# Patient Record
Sex: Male | Born: 1980
Health system: Southern US, Community
[De-identification: ages and names within clinical notes are randomized; demographics above are authoritative.]

## PROBLEM LIST (undated history)

## (undated) DIAGNOSIS — I10 Essential (primary) hypertension: Secondary | ICD-10-CM

## (undated) DIAGNOSIS — E119 Type 2 diabetes mellitus without complications: Secondary | ICD-10-CM

---

## 2010-10-31 DIAGNOSIS — E119 Type 2 diabetes mellitus without complications: Secondary | ICD-10-CM | POA: Insufficient documentation

## 2010-10-31 DIAGNOSIS — I10 Essential (primary) hypertension: Secondary | ICD-10-CM | POA: Insufficient documentation

## 2011-02-02 DIAGNOSIS — G4733 Obstructive sleep apnea (adult) (pediatric): Secondary | ICD-10-CM | POA: Insufficient documentation

## 2018-11-12 DIAGNOSIS — K429 Umbilical hernia without obstruction or gangrene: Secondary | ICD-10-CM | POA: Insufficient documentation

## 2020-02-13 ENCOUNTER — Emergency Department (HOSPITAL_COMMUNITY)
Admission: EM | Admit: 2020-02-13 | Discharge: 2020-02-13 | Discharge status: Against Medical Advice | Payer: Medicaid - Out of State | Attending: Emergency Medicine | Admitting: Emergency Medicine

## 2020-02-13 ENCOUNTER — Emergency Department (HOSPITAL_COMMUNITY): Payer: Medicaid - Out of State

## 2020-02-13 ENCOUNTER — Other Ambulatory Visit: Payer: Self-pay

## 2020-02-13 ENCOUNTER — Encounter (HOSPITAL_COMMUNITY): Payer: Self-pay | Admitting: *Deleted

## 2020-02-13 DIAGNOSIS — M79631 Pain in right forearm: Secondary | ICD-10-CM | POA: Diagnosis not present

## 2020-02-13 DIAGNOSIS — M542 Cervicalgia: Secondary | ICD-10-CM | POA: Diagnosis not present

## 2020-02-13 DIAGNOSIS — R61 Generalized hyperhidrosis: Secondary | ICD-10-CM | POA: Insufficient documentation

## 2020-02-13 DIAGNOSIS — F17210 Nicotine dependence, cigarettes, uncomplicated: Secondary | ICD-10-CM | POA: Diagnosis not present

## 2020-02-13 DIAGNOSIS — I10 Essential (primary) hypertension: Secondary | ICD-10-CM | POA: Diagnosis not present

## 2020-02-13 DIAGNOSIS — Z5329 Procedure and treatment not carried out because of patient's decision for other reasons: Secondary | ICD-10-CM

## 2020-02-13 DIAGNOSIS — M25511 Pain in right shoulder: Secondary | ICD-10-CM | POA: Diagnosis present

## 2020-02-13 DIAGNOSIS — R000 Tachycardia, unspecified: Secondary | ICD-10-CM | POA: Insufficient documentation

## 2020-02-13 DIAGNOSIS — E1165 Type 2 diabetes mellitus with hyperglycemia: Secondary | ICD-10-CM | POA: Insufficient documentation

## 2020-02-13 DIAGNOSIS — R739 Hyperglycemia, unspecified: Secondary | ICD-10-CM

## 2020-02-13 HISTORY — DX: Essential (primary) hypertension: I10

## 2020-02-13 HISTORY — DX: Type 2 diabetes mellitus without complications: E11.9

## 2020-02-13 LAB — COMPREHENSIVE METABOLIC PANEL
ALT: 53 U/L — ABNORMAL HIGH (ref 0–44)
AST: 38 U/L (ref 15–41)
Albumin: 4.2 g/dL (ref 3.5–5.0)
Alkaline Phosphatase: 109 U/L (ref 38–126)
Anion gap: 9 (ref 5–15)
BUN: 19 mg/dL (ref 6–20)
CO2: 21 mmol/L — ABNORMAL LOW (ref 22–32)
Calcium: 9.1 mg/dL (ref 8.9–10.3)
Chloride: 102 mmol/L (ref 98–111)
Creatinine, Ser: 1.6 mg/dL — ABNORMAL HIGH (ref 0.61–1.24)
GFR, Estimated: 56 mL/min — ABNORMAL LOW (ref >60)
Glucose, Bld: 444 mg/dL — ABNORMAL HIGH (ref 70–99)
Potassium: 4.3 mmol/L (ref 3.5–5.1)
Sodium: 132 mmol/L — ABNORMAL LOW (ref 135–145)
Total Bilirubin: 0.5 mg/dL (ref 0.3–1.2)
Total Protein: 8.3 g/dL — ABNORMAL HIGH (ref 6.5–8.1)

## 2020-02-13 LAB — CBC WITH DIFFERENTIAL/PLATELET
Abs Immature Granulocytes: 0.02 10*3/uL (ref 0.00–0.07)
Basophils Absolute: 0.1 10*3/uL (ref 0.0–0.1)
Basophils Relative: 1 %
Eosinophils Absolute: 0.2 10*3/uL (ref 0.0–0.5)
Eosinophils Relative: 3 %
HCT: 42.2 % (ref 39.0–52.0)
Hemoglobin: 14.5 g/dL (ref 13.0–17.0)
Immature Granulocytes: 0 %
Lymphocytes Relative: 37 %
Lymphs Abs: 2.3 10*3/uL (ref 0.7–4.0)
MCH: 31.9 pg (ref 26.0–34.0)
MCHC: 34.4 g/dL (ref 30.0–36.0)
MCV: 93 fL (ref 80.0–100.0)
Monocytes Absolute: 0.4 10*3/uL (ref 0.1–1.0)
Monocytes Relative: 7 %
Neutro Abs: 3.3 10*3/uL (ref 1.7–7.7)
Neutrophils Relative %: 52 %
Platelets: 288 10*3/uL (ref 150–400)
RBC: 4.54 MIL/uL (ref 4.22–5.81)
RDW: 13.2 % (ref 11.5–15.5)
WBC: 6.3 10*3/uL (ref 4.0–10.5)
nRBC: 0 % (ref 0.0–0.2)

## 2020-02-13 LAB — CBG MONITORING, ED: Glucose-Capillary: 433 mg/dL — ABNORMAL HIGH (ref 70–99)

## 2020-02-13 IMAGING — DX DG CHEST 2V
2 series · 2 of 2 positions shown · non-contrast
Comparison: None.

CLINICAL DATA: Tachycardia

EXAM:
CHEST - 2 VIEW

[chest pa]
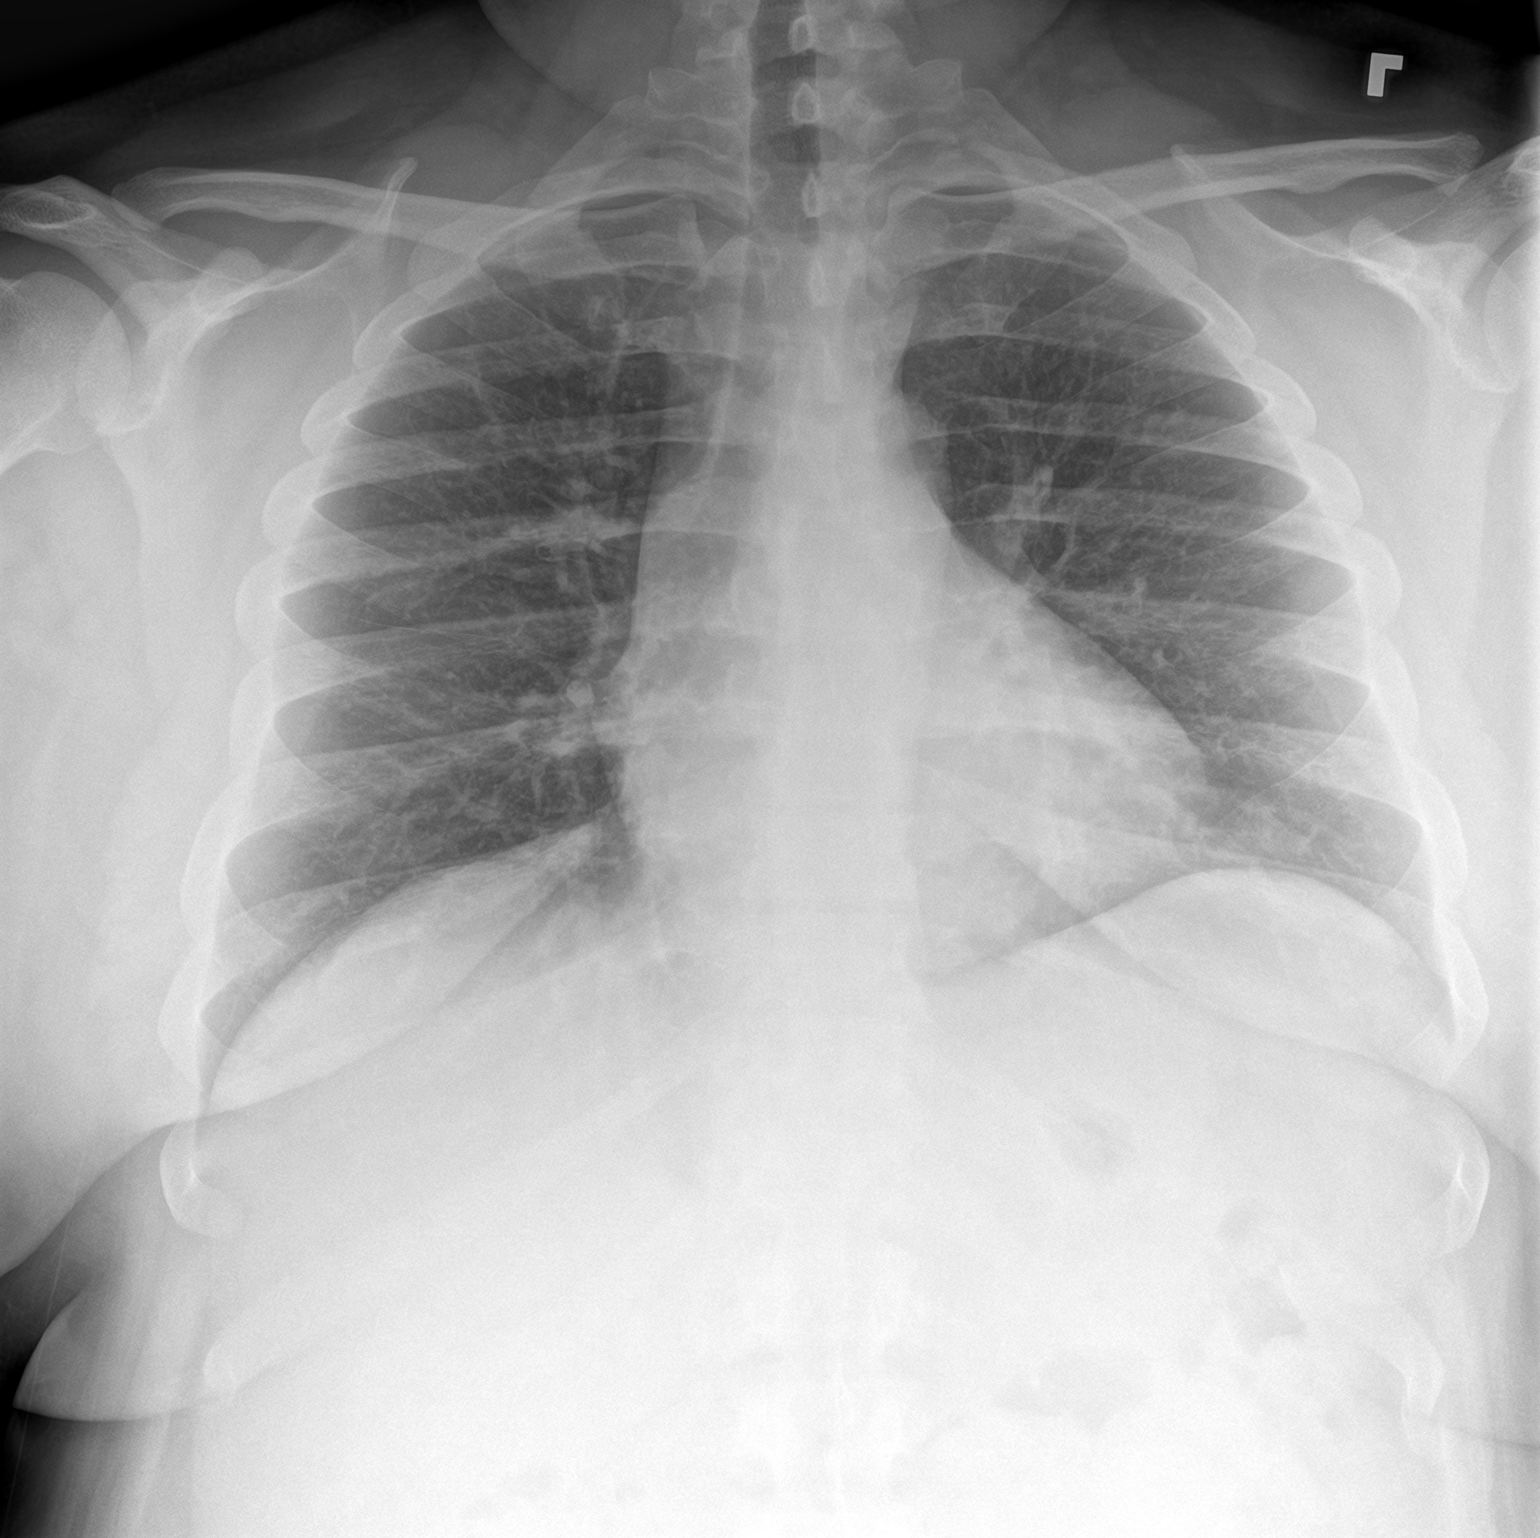

[chest lat]
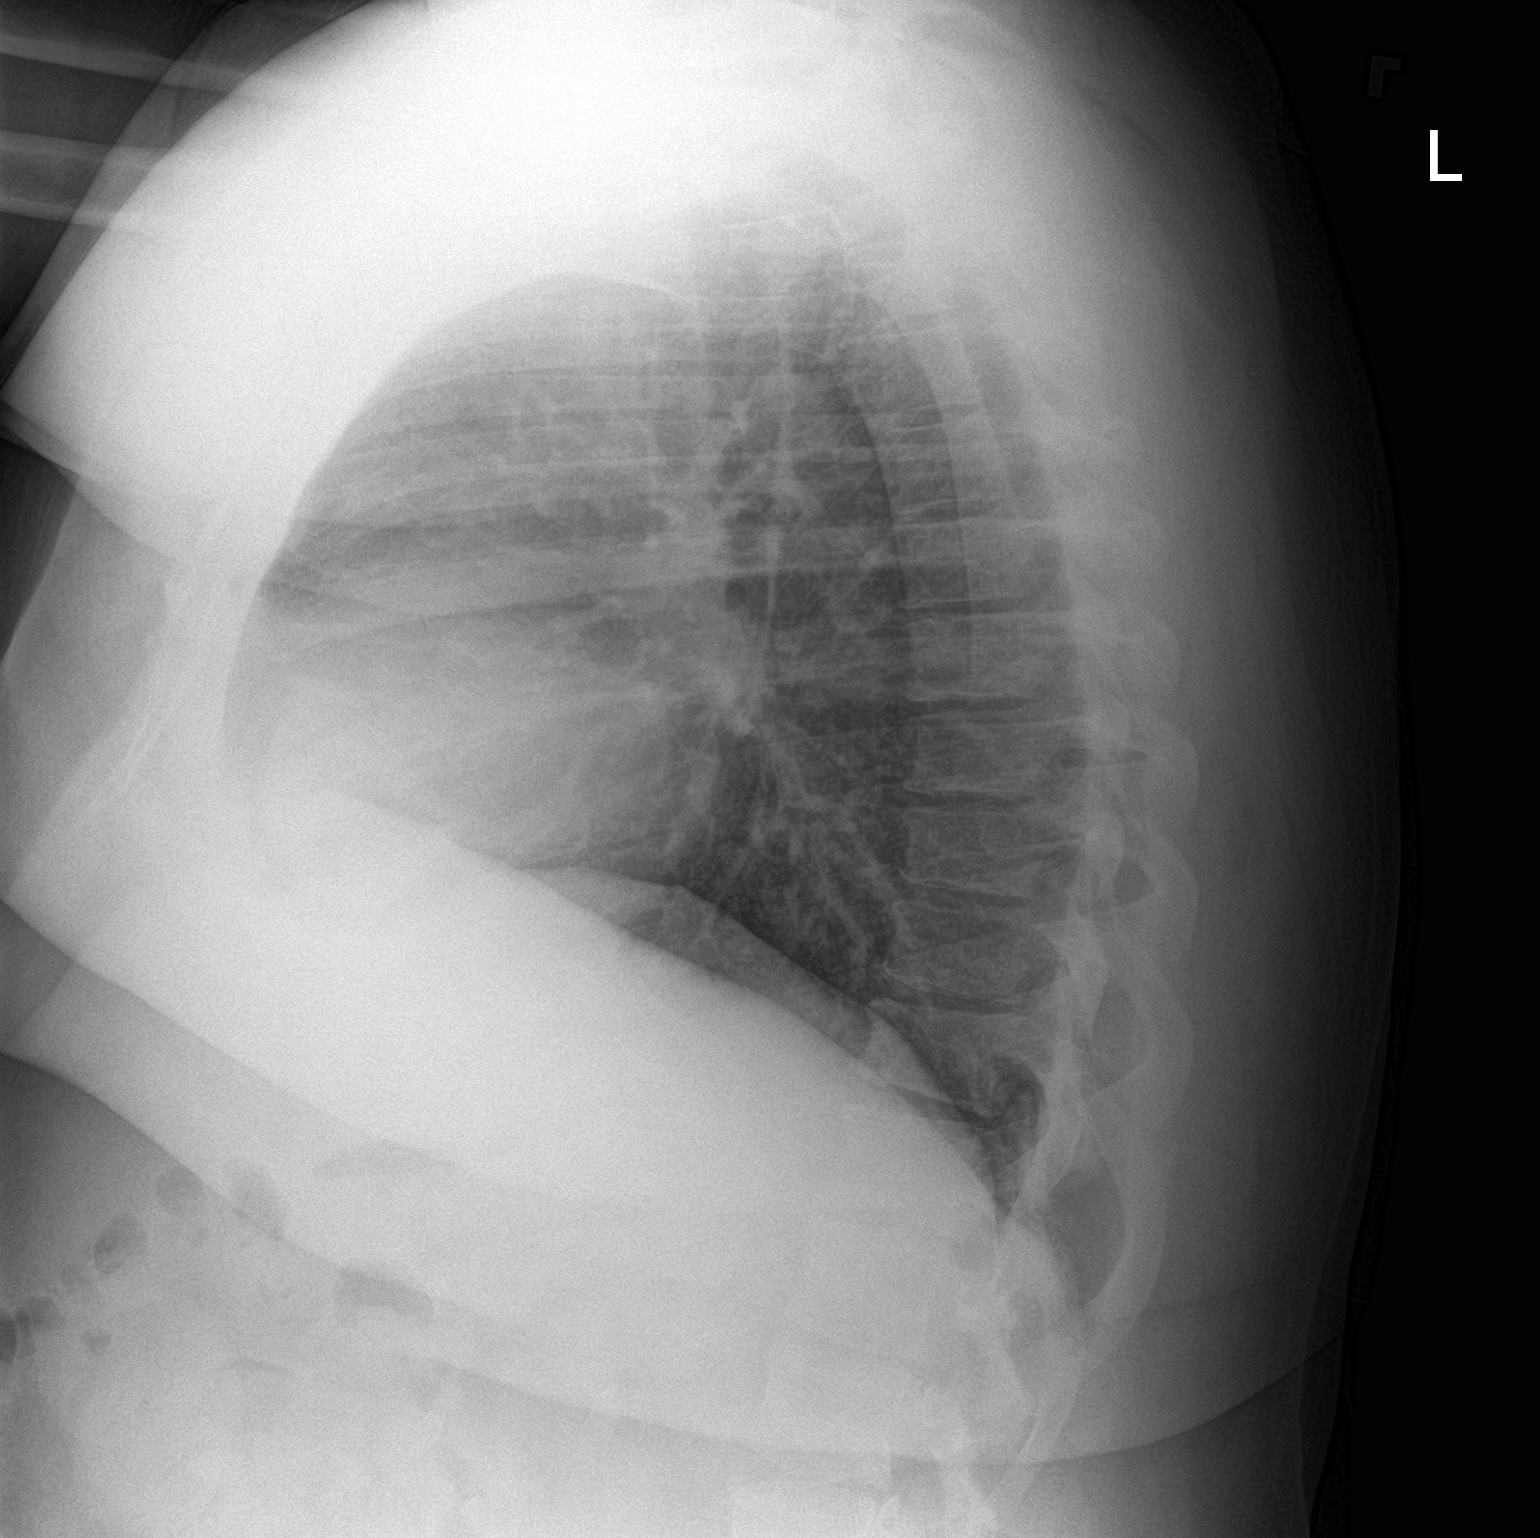

[2 of 2 positions shown; findings below may reference images not displayed]

FINDINGS: The heart size and mediastinal contours are within normal limits.
Both lungs are clear. The visualized skeletal structures are
unremarkable.
IMPRESSION: No active cardiopulmonary disease.

## 2020-02-13 IMAGING — DX DG FOREARM 2V*R*
2 series · 2 of 2 positions shown · non-contrast
Comparison: None.

CLINICAL DATA: Injury, arm pain

EXAM:
RIGHT FOREARM - 2 VIEW

[forearm ap]
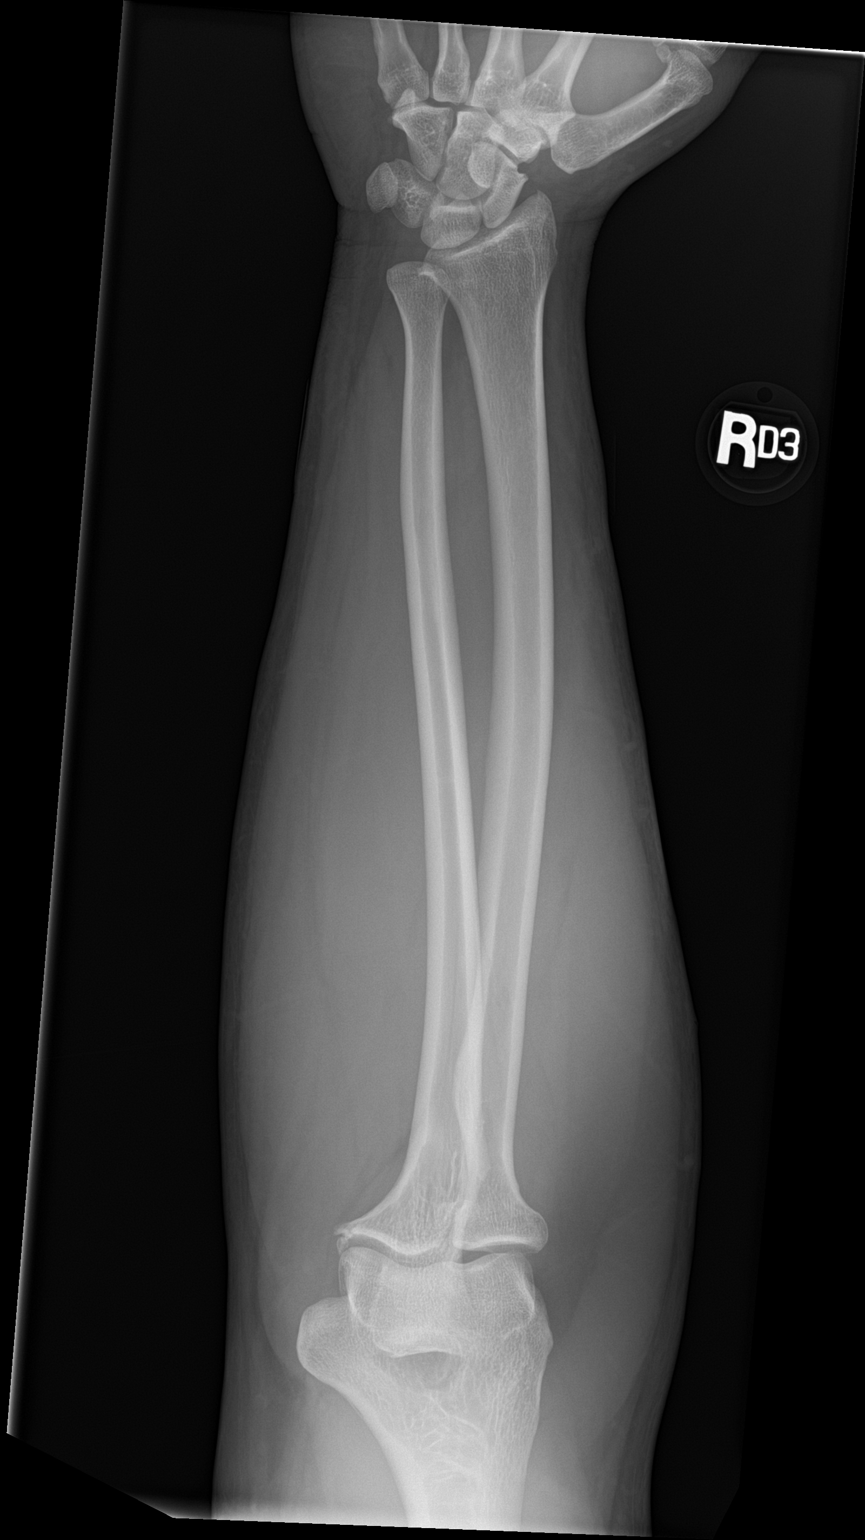

[forearm lat]
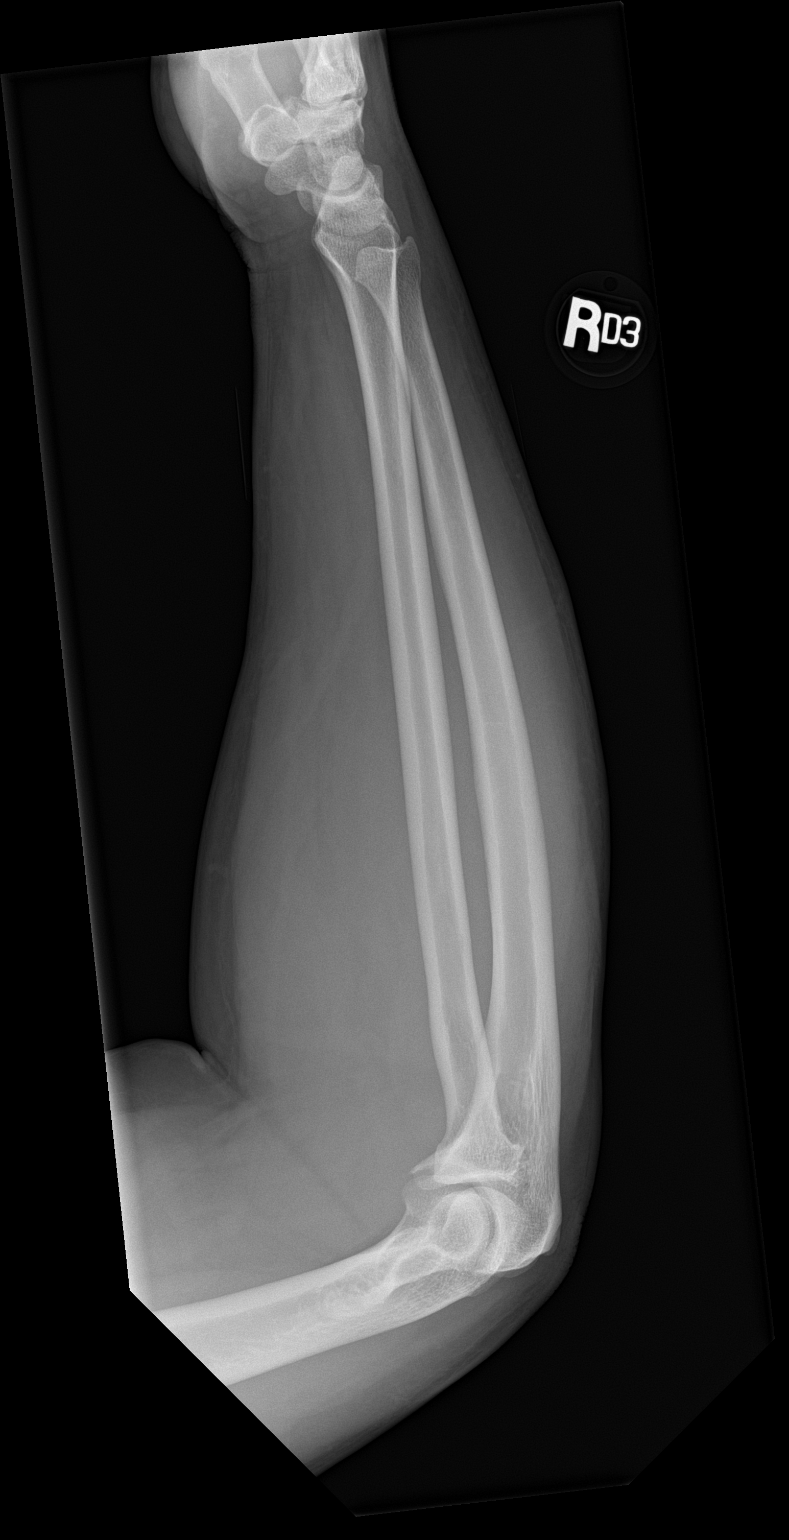

[2 of 2 positions shown; findings below may reference images not displayed]

FINDINGS: There is no evidence of fracture or other focal bone lesions. Soft
tissues are unremarkable.
IMPRESSION: Negative.

## 2020-02-13 IMAGING — DX DG SHOULDER 2+V*R*
3 series · 3 of 3 positions shown · non-contrast
Comparison: None.

CLINICAL DATA: Right shoulder pain, injury

EXAM:
RIGHT SHOULDER - 2+ VIEW

[shoulder y view]
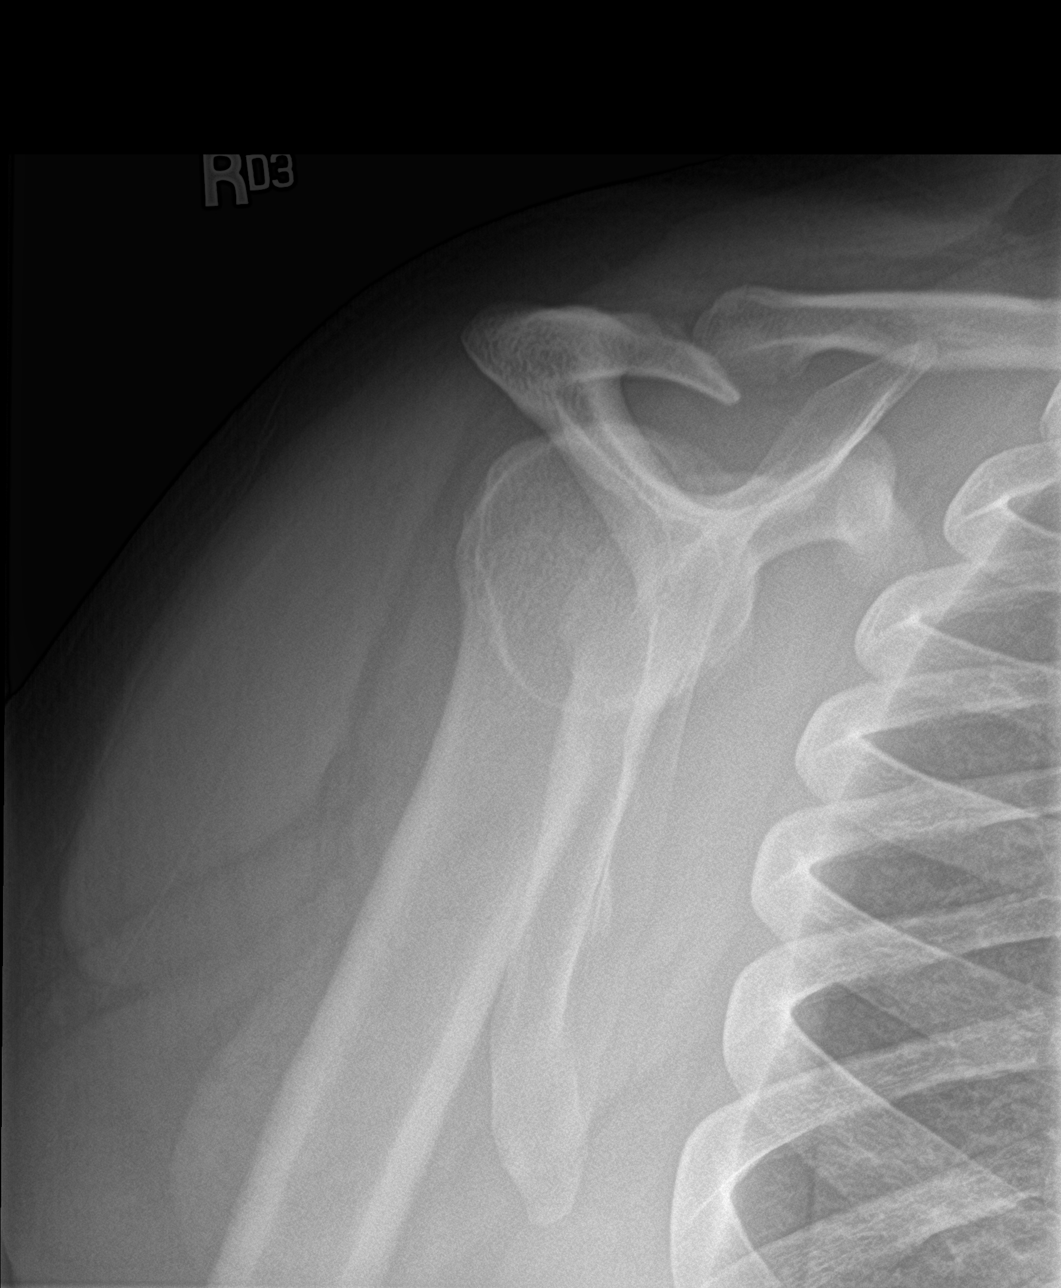

[shoulder grashey]
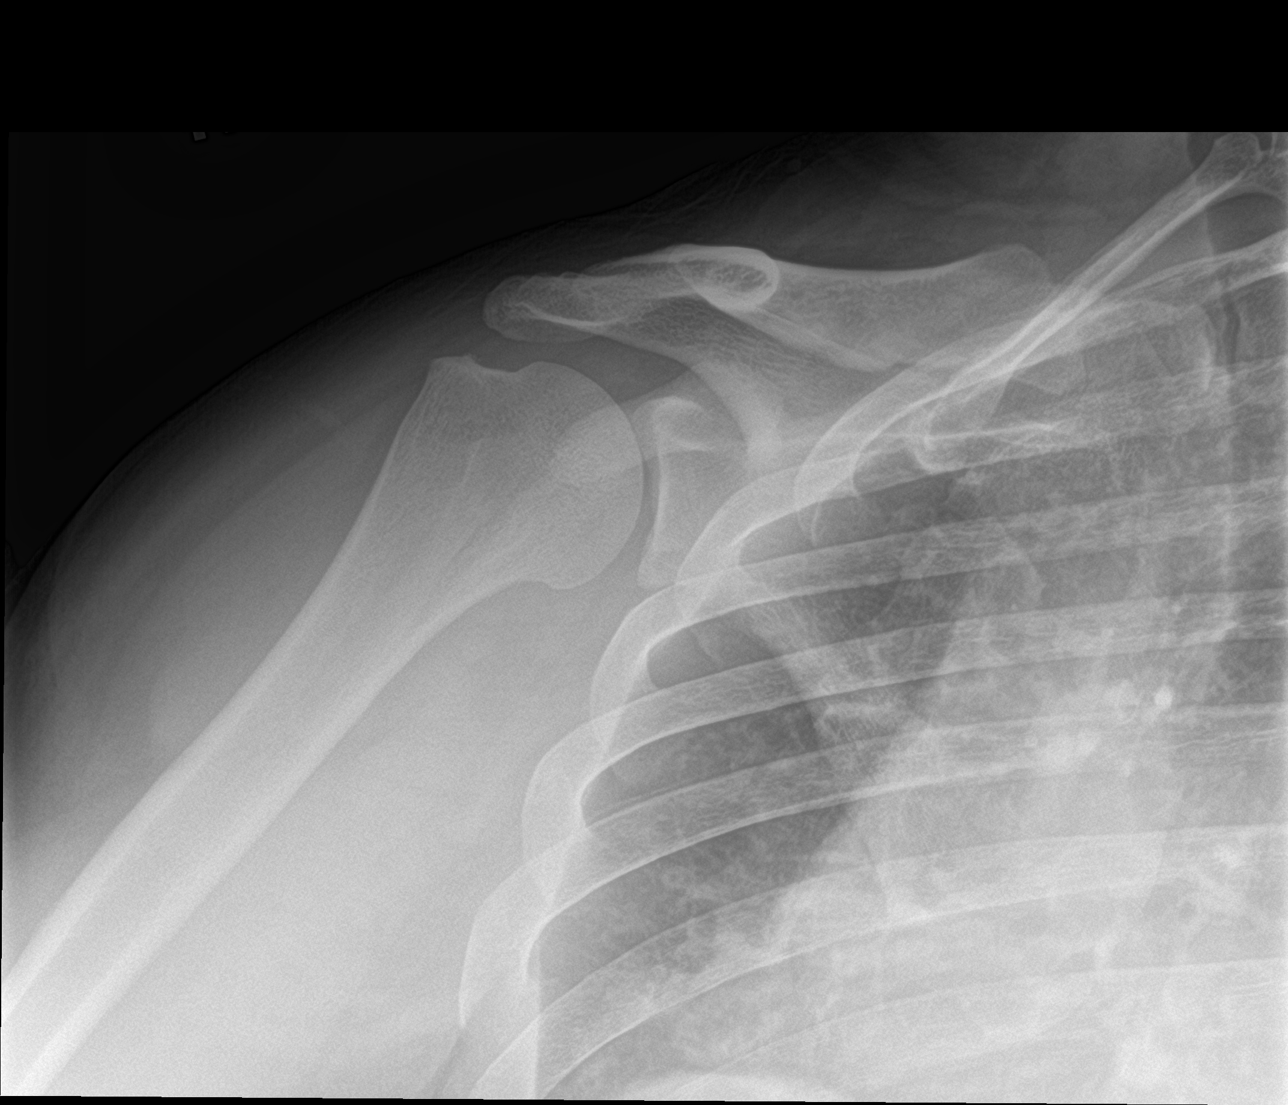

[shoulder axillary]
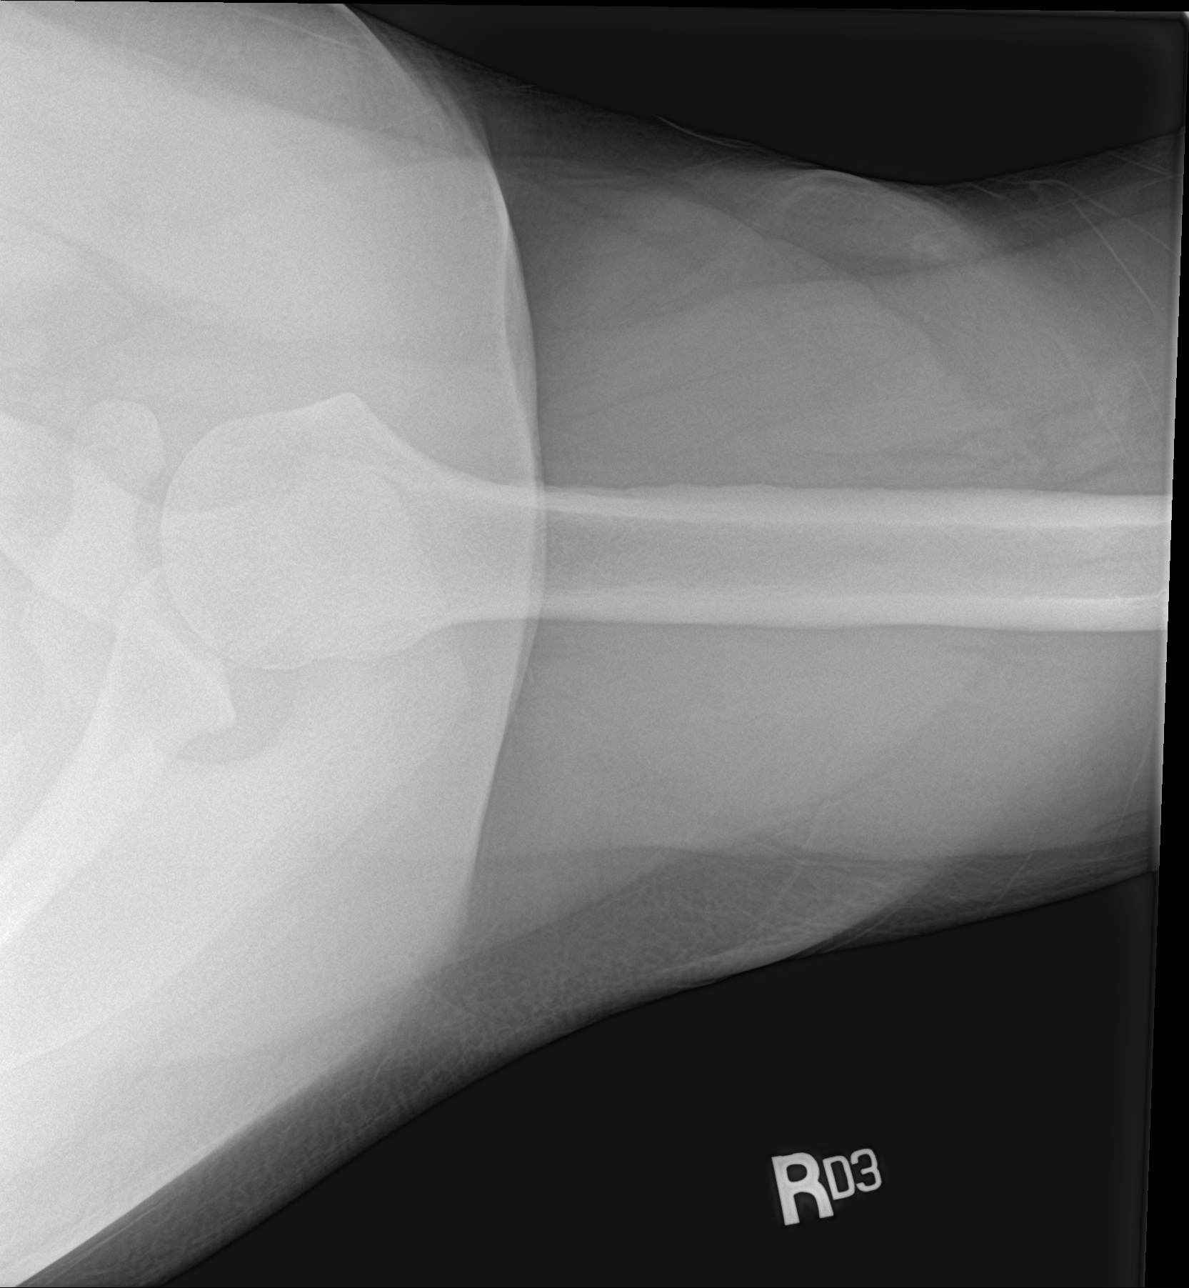

[3 of 3 positions shown; findings below may reference images not displayed]

FINDINGS: There is no evidence of fracture or dislocation. There is no
evidence of arthropathy or other focal bone abnormality. Soft
tissues are unremarkable.
IMPRESSION: Negative.

## 2020-02-13 MED ORDER — LACTATED RINGERS IV BOLUS: 1000.0000 mL | Freq: Once | INTRAVENOUS | Status: DC

## 2020-02-13 MED ORDER — LACTATED RINGERS IV BOLUS: 500.0000 mL | Freq: Once | INTRAVENOUS | Status: DC
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
## 2020-02-13 NOTE — Discharge Instructions (Signed)
As we discussed today your blood sugar is very high as is your blood pressure and your heart rate.  Today we discussed that your x-rays were overall reassuring however I recommended consideration of a CT scan on your neck.  In order to obtain this you would need IV contrast and therefore labs were obtained.  Your blood sugar was very high at 433 with a high heart rate and high blood pressure.  This combined with your sweating made me concerned that you may have an underlying electrolyte abnormality or condition.  I recommended that you stay for additional labs, evaluation, and treatment as indicated.  We discussed that failing to do this and leaving could result in delayed diagnosis and treatment which may result in severe life-threatening condition going undiagnosed. Additionally we discussed that by leaving AGAINST MEDICAL ADVICE you are at risk of death, disability that may be severe, chronic pain, and worsening of condition.  You stated your understanding of these risks.  As I do not have the remainder of your labs I do not know what medications I can safely start you on at this time therefore I would recommend diet and lifestyle changes and strongly recommend returning to the emergency room.

## 2020-02-13 NOTE — ED Notes (Signed)
Pt is declining every thing but the xray. Lanora Manis PA informed. Pt wants to speak with the PA. Elizabeth informed.

## 2020-02-15 ENCOUNTER — Emergency Department (HOSPITAL_COMMUNITY): Payer: Medicaid - Out of State

## 2020-02-15 ENCOUNTER — Encounter (HOSPITAL_COMMUNITY): Payer: Self-pay | Admitting: Emergency Medicine

## 2020-02-15 ENCOUNTER — Other Ambulatory Visit: Payer: Self-pay

## 2020-02-15 ENCOUNTER — Emergency Department (HOSPITAL_COMMUNITY)
Admission: EM | Admit: 2020-02-15 | Discharge: 2020-02-15 | Disposition: A | Discharge status: Home / Self Care | Payer: Medicaid - Out of State | Attending: Emergency Medicine | Admitting: Emergency Medicine

## 2020-02-15 DIAGNOSIS — Z7984 Long term (current) use of oral hypoglycemic drugs: Secondary | ICD-10-CM | POA: Insufficient documentation

## 2020-02-15 DIAGNOSIS — M542 Cervicalgia: Secondary | ICD-10-CM | POA: Insufficient documentation

## 2020-02-15 DIAGNOSIS — M25511 Pain in right shoulder: Secondary | ICD-10-CM | POA: Diagnosis not present

## 2020-02-15 DIAGNOSIS — E871 Hypo-osmolality and hyponatremia: Secondary | ICD-10-CM | POA: Insufficient documentation

## 2020-02-15 DIAGNOSIS — Z79899 Other long term (current) drug therapy: Secondary | ICD-10-CM | POA: Diagnosis not present

## 2020-02-15 DIAGNOSIS — E1165 Type 2 diabetes mellitus with hyperglycemia: Secondary | ICD-10-CM | POA: Insufficient documentation

## 2020-02-15 DIAGNOSIS — R739 Hyperglycemia, unspecified: Secondary | ICD-10-CM

## 2020-02-15 DIAGNOSIS — I10 Essential (primary) hypertension: Secondary | ICD-10-CM | POA: Diagnosis not present

## 2020-02-15 DIAGNOSIS — M7918 Myalgia, other site: Secondary | ICD-10-CM

## 2020-02-15 DIAGNOSIS — F17210 Nicotine dependence, cigarettes, uncomplicated: Secondary | ICD-10-CM | POA: Insufficient documentation

## 2020-02-15 DIAGNOSIS — M791 Myalgia, unspecified site: Secondary | ICD-10-CM | POA: Diagnosis not present

## 2020-02-15 LAB — BASIC METABOLIC PANEL
Anion gap: 8 (ref 5–15)
BUN: 22 mg/dL — ABNORMAL HIGH (ref 6–20)
CO2: 20 mmol/L — ABNORMAL LOW (ref 22–32)
Calcium: 9.8 mg/dL (ref 8.9–10.3)
Chloride: 98 mmol/L (ref 98–111)
Creatinine, Ser: 1.35 mg/dL — ABNORMAL HIGH (ref 0.61–1.24)
GFR, Estimated: >60 mL/min (ref >60)
Glucose, Bld: 425 mg/dL — ABNORMAL HIGH (ref 70–99)
Potassium: 4.1 mmol/L (ref 3.5–5.1)
Sodium: 126 mmol/L — ABNORMAL LOW (ref 135–145)

## 2020-02-15 LAB — CBG MONITORING, ED
Glucose-Capillary: 434 mg/dL — ABNORMAL HIGH (ref 70–99)
Glucose-Capillary: 398 mg/dL — ABNORMAL HIGH (ref 70–99)
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
## 2020-02-15 MED ORDER — METFORMIN HCL ER 500 MG PO TB24: 500.0000 mg | ORAL_TABLET | Freq: Every day | ORAL | 0 refills | Status: DC

## 2020-02-15 MED ORDER — IOHEXOL 350 MG/ML SOLN
75.0000 mL | Freq: Once | INTRAVENOUS | Status: AC | PRN
  Administered 2020-02-15: 75 mL via INTRAVENOUS

## 2020-02-15 MED ORDER — METHOCARBAMOL 750 MG PO TABS: 750.0000 mg | ORAL_TABLET | Freq: Every evening | ORAL | 0 refills | Status: AC | PRN

## 2020-02-15 MED ORDER — SODIUM CHLORIDE 0.9 % IV BOLUS
1000.0000 mL | Freq: Once | INTRAVENOUS | Status: AC
  Administered 2020-02-15: 1000 mL via INTRAVENOUS

## 2020-02-15 MED ORDER — AMLODIPINE BESYLATE 5 MG PO TABS: 5.0000 mg | ORAL_TABLET | Freq: Every day | ORAL | 0 refills | Status: DC

## 2020-02-15 NOTE — ED Triage Notes (Signed)
Pt returns to find out the results of his x-rays on his shoulder and neck. Pt also would like to know to know the results of his blood work.

## 2020-02-15 NOTE — ED Provider Notes (Signed)
[facility] EMERGENCY DEPARTMENT Provider Note   CSN: [id number] Arrival date & time: 02/15/20  1012     History Chief Complaint  Patient presents with  . Shoulder Pain    David Eaton is a 40 y.o. male.  HPI   40 y/o M with a h/o DM, HTN, who presents to the ED today for eval of shoulder pain. States that he has pain to the right arm and right side of the neck after an injury that occurred a few days ago. He reports swelling to the right side of his neck and intermittent pain to this area. He was seen in the ed a few days ago for his sxs and ultimately left ama prior to the completion of his w/u. He states that no one has followed up with him since he was discharged and he wants to know what the results of his imaging showed.   Past Medical History:  Diagnosis Date  . Diabetes mellitus without complication (HCC)   . Hypertension     There are no problems to display for this patient.   History reviewed. No pertinent surgical history.     History reviewed. No pertinent family history.  Social History   Tobacco Use  . Smoking status: Current Every Day Smoker    Types: Cigarettes  . Smokeless tobacco: Never Used  Vaping Use  . Vaping Use: Never used  Substance Use Topics  . Alcohol use: Not Currently  . Drug use: Not Currently    Home Medications Prior to Admission medications   Medication Sig Start Date End Date Taking? Authorizing Provider  amLODipine (NORVASC) 5 MG tablet Take 1 tablet (5 mg total) by mouth daily. 02/15/20  Yes Cyrene Gharibian S, PA-C  metFORMIN (GLUCOPHAGE-XR) 500 MG 24 hr tablet Take 1 tablet (500 mg total) by mouth daily with breakfast. 02/15/20  Yes Dorin Stooksbury S, PA-C  methocarbamol (ROBAXIN) 750 MG tablet Take 1 tablet (750 mg total) by mouth at bedtime as needed for up to 5 days for muscle spasms. 02/15/20 02/20/20 Yes Alantra Popoca S, PA-C    Allergies    Patient has no known allergies.  Review of Systems   Review of Systems   Constitutional: Negative for fever.  HENT: Negative for ear pain and sore throat.   Eyes: Negative for pain.  Respiratory: Negative for shortness of breath.   Cardiovascular: Negative for chest pain.  Gastrointestinal: Negative for abdominal pain, constipation, diarrhea, nausea and vomiting.  Genitourinary: Negative for flank pain.  Musculoskeletal: Positive for neck pain. Negative for back pain.       RUE pain  Skin: Negative for rash.  Neurological: Positive for numbness. Negative for dizziness, weakness, light-headedness and headaches. Seizures: intermittent rue numbness/paresthesias.  All other systems reviewed and are negative.   Physical Exam Updated Vital Signs BP (!) 160/126 (BP Location: Right Arm)   Pulse (!) 109   Temp 97.8 F (36.6 C) (Oral)   Resp 20   Ht 5\' 5"  (1.651 m)   Wt 122.5 kg   SpO2 98%   BMI 44.93 kg/m   Physical Exam Vitals and nursing note reviewed.  Constitutional:      Appearance: He is well-developed and well-nourished.  HENT:     Head: Normocephalic and atraumatic.  Eyes:     Conjunctiva/sclera: Conjunctivae normal.  Cardiovascular:     Rate and Rhythm: Normal rate and regular rhythm.     Heart sounds: No murmur heard.   Pulmonary:  Effort: Pulmonary effort is normal. No respiratory distress.     Breath sounds: Normal breath sounds.  Abdominal:     Palpations: Abdomen is soft.     Tenderness: There is no abdominal tenderness.  Musculoskeletal:        General: No edema.     Cervical back: Neck supple.     Comments: TTP noted to the musculature of the ventral and dorsal forearm. Compartments are soft and easily reducible. There is no focal bony TTP or obvious deformity. Distal pulses are intact and ROM is intact.   Skin:    General: Skin is warm and dry.  Neurological:     Mental Status: He is alert.     Comments: Mental Status:  Alert, thought content appropriate, able to give a coherent history. Speech fluent without evidence of  aphasia. Able to follow 2 step commands without difficulty.  Cranial Nerves:  II: pupils equal, round, reactive to light III,IV, VI: ptosis not present, extra-ocular motions intact bilaterally  V,VII: smile symmetric, facial light touch sensation equal VIII: hearing grossly normal to voice  X: uvula elevates symmetrically  XI: bilateral shoulder shrug symmetric and strong XII: midline tongue extension without fassiculations Motor:  Normal tone. 5/5 strength of BUE and BLE major muscle groups including strong and equal grip strength and dorsiflexion/plantar flexion Sensory: light touch normal in all extremities. Gait: normal gait and balance.    Psychiatric:        Mood and Affect: Mood and affect normal.     ED Results / Procedures / Treatments   Labs (all labs ordered are listed, but only abnormal results are displayed) Labs Reviewed  BASIC METABOLIC PANEL - Abnormal; Notable for the following components:      Result Value   Sodium 126 (*)    CO2 20 (*)    Glucose, Bld 425 (*)    BUN 22 (*)    Creatinine, Ser 1.35 (*)    All other components within normal limits  CBG MONITORING, ED - Abnormal; Notable for the following components:   Glucose-Capillary 434 (*)    All other components within normal limits  CBG MONITORING, ED - Abnormal; Notable for the following components:   Glucose-Capillary 398 (*)    All other components within normal limits    EKG None  Radiology CT Angio Head W or Wo Contrast  Result Date: 02/15/2020 CLINICAL DATA:  Neck trauma 4 days ago. Rule out arterial injury. Right arm pain EXAM: CT ANGIOGRAPHY HEAD AND NECK TECHNIQUE: Multidetector CT imaging of the head and neck was performed using the standard protocol during bolus administration of intravenous contrast. Multiplanar CT image reconstructions and MIPs were obtained to evaluate the vascular anatomy. Carotid stenosis measurements (when applicable) are obtained utilizing NASCET criteria, using the  distal internal carotid diameter as the denominator. CONTRAST:  73mL OMNIPAQUE IOHEXOL 350 MG/ML SOLN COMPARISON:  None. FINDINGS: CT HEAD FINDINGS Brain: Ventricle size and cerebral volume normal. Patchy hypodensity in the frontal white matter bilaterally. Negative for acute cortical infarct, hemorrhage, mass Vascular: Negative for hyperdense vessel Skull: Negative Sinuses: Negative Orbits: Negative Review of the MIP images confirms the above findings CTA NECK FINDINGS Aortic arch: Standard branching. Imaged portion shows no evidence of aneurysm or dissection. No significant stenosis of the major arch vessel origins. Right carotid system: Normal right carotid. Negative for stenosis or dissection. Left carotid system: Normal left carotid. Negative for stenosis or dissection Vertebral arteries: Both vertebral arteries are normal. Right vertebral artery dominant.  Skeleton: Normal cervical spine.  Numerous caries. Other neck: Negative for mass or adenopathy. Upper chest: Lung apices clear bilaterally. Review of the MIP images confirms the above findings CTA HEAD FINDINGS Anterior circulation: Normal internal carotid artery through the skull base. Anterior and middle cerebral arteries normal bilaterally. Posterior circulation: Normal posterior circulation. No arterial stenosis or occlusion. Venous sinuses: Normal venous enhancement Anatomic variants: None Review of the MIP images confirms the above findings IMPRESSION: 1. No acute intracranial abnormality. Patchy white matter hypodensity bilaterally. This is nonspecific but most likely due to chronic ischemia, given history of hypertension and diabetes. This is advanced for age. 2. Normal CT angiogram head neck.  No arterial injury or stenosis. Electronically Signed   By: Marlan Palau M.D.   On: 02/15/2020 13:24   DG Chest 2 View  Result Date: 02/13/2020 CLINICAL DATA:  Tachycardia EXAM: CHEST - 2 VIEW COMPARISON:  None. FINDINGS: The heart size and mediastinal  contours are within normal limits. Both lungs are clear. The visualized skeletal structures are unremarkable. IMPRESSION: No active cardiopulmonary disease. Electronically Signed   By: Charlett Nose M.D.   On: 02/13/2020 20:37   DG Shoulder Right  Result Date: 02/13/2020 CLINICAL DATA:  Right shoulder pain, injury EXAM: RIGHT SHOULDER - 2+ VIEW COMPARISON:  None. FINDINGS: There is no evidence of fracture or dislocation. There is no evidence of arthropathy or other focal bone abnormality. Soft tissues are unremarkable. IMPRESSION: Negative. Electronically Signed   By: Charlett Nose M.D.   On: 02/13/2020 18:52   DG Forearm Right  Result Date: 02/13/2020 CLINICAL DATA:  Injury, arm pain EXAM: RIGHT FOREARM - 2 VIEW COMPARISON:  None. FINDINGS: There is no evidence of fracture or other focal bone lesions. Soft tissues are unremarkable. IMPRESSION: Negative. Electronically Signed   By: Charlett Nose M.D.   On: 02/13/2020 18:57   CT Angio Neck W and/or Wo Contrast  Result Date: 02/15/2020 CLINICAL DATA:  Neck trauma 4 days ago. Rule out arterial injury. Right arm pain EXAM: CT ANGIOGRAPHY HEAD AND NECK TECHNIQUE: Multidetector CT imaging of the head and neck was performed using the standard protocol during bolus administration of intravenous contrast. Multiplanar CT image reconstructions and MIPs were obtained to evaluate the vascular anatomy. Carotid stenosis measurements (when applicable) are obtained utilizing NASCET criteria, using the distal internal carotid diameter as the denominator. CONTRAST:  1mL OMNIPAQUE IOHEXOL 350 MG/ML SOLN COMPARISON:  None. FINDINGS: CT HEAD FINDINGS Brain: Ventricle size and cerebral volume normal. Patchy hypodensity in the frontal white matter bilaterally. Negative for acute cortical infarct, hemorrhage, mass Vascular: Negative for hyperdense vessel Skull: Negative Sinuses: Negative Orbits: Negative Review of the MIP images confirms the above findings CTA NECK FINDINGS Aortic  arch: Standard branching. Imaged portion shows no evidence of aneurysm or dissection. No significant stenosis of the major arch vessel origins. Right carotid system: Normal right carotid. Negative for stenosis or dissection. Left carotid system: Normal left carotid. Negative for stenosis or dissection Vertebral arteries: Both vertebral arteries are normal. Right vertebral artery dominant. Skeleton: Normal cervical spine.  Numerous caries. Other neck: Negative for mass or adenopathy. Upper chest: Lung apices clear bilaterally. Review of the MIP images confirms the above findings CTA HEAD FINDINGS Anterior circulation: Normal internal carotid artery through the skull base. Anterior and middle cerebral arteries normal bilaterally. Posterior circulation: Normal posterior circulation. No arterial stenosis or occlusion. Venous sinuses: Normal venous enhancement Anatomic variants: None Review of the MIP images confirms the above findings IMPRESSION: 1. No  acute intracranial abnormality. Patchy white matter hypodensity bilaterally. This is nonspecific but most likely due to chronic ischemia, given history of hypertension and diabetes. This is advanced for age. 2. Normal CT angiogram head neck.  No arterial injury or stenosis. Electronically Signed   By: Marlan Palau M.D.   On: 02/15/2020 13:24    Procedures Procedures   Medications Ordered in ED Medications  sodium chloride 0.9 % bolus 1,000 mL (1,000 mLs Intravenous New Bag/Given 02/15/20 1220)  iohexol (OMNIPAQUE) 350 MG/ML injection 75 mL (75 mLs Intravenous Contrast Given 02/15/20 1255)    ED Course  I have reviewed the triage vital signs and the nursing notes.  Pertinent labs & imaging results that were available during my care of the patient were reviewed by me and considered in my medical decision making (see chart for details).    MDM Rules/Calculators/A&P                          40 y/o M presenting for eval of RUE pain, right neck pain. Seen  in ED a few days ago and had neg imaging of the right shoulder, forearm and chest. Was planned for CT neck but he left ama.   Reviewed/interpreted labs BMP with pseudohyponatremia, elevated glucose, and normal anion gap   - no evidence of dka  CTA head/neck -  1. No acute intracranial abnormality. Patchy white matter hypodensity bilaterally. This is nonspecific but most likely due to chronic ischemia, given history of hypertension and diabetes. This is advanced for age. 2. Normal CT angiogram head neck.  No arterial injury or stenosis.  W/u does not reveal any traumatic vascular injury. Pt remains neurologically intact following evaluation. I suspect his sxs are related to muscle spasm vs strain. The xrays from his prior visit are reassuring. I will give rx for muscle relaxers for home. Additionally, at his prior visit there was concern that he needed refills of his chronic medications. He states he was on metformin and a BP med. Will restart low dose metformin and amlodipine. He does not appear to have a hyperglycemic or hypertensive emergency here in the department today. Will give info for pcp f/u. Advised on return precautions. He voices understanding of the plan and reasons to return. All questions answered, pt stable for discharge.  Pt was discussed with Dr. Charm Barges who is in agreement with the plan.   Final Clinical Impression(s) / ED Diagnoses Final diagnoses:  Musculoskeletal pain  Hyperglycemia  Hypertension, unspecified type    Rx / DC Orders ED Discharge Orders         Ordered    metFORMIN (GLUCOPHAGE-XR) 500 MG 24 hr tablet  Daily with breakfast        02/15/20 1407    methocarbamol (ROBAXIN) 750 MG tablet  At bedtime PRN        02/15/20 1407    amLODipine (NORVASC) 5 MG tablet  Daily        02/15/20 1407           Crescent Gotham, [place], PA-C 02/15/20 1407    Terrilee Files, MD 02/15/20 1755

## 2020-02-15 NOTE — Discharge Instructions (Signed)
You may take Tylenol as needed for pain control. You may take [id number] mg of Tylenol every 6 hours. Do not exceed 4000 mg of Tylenol daily as this can lead to liver damage. You may use warm and cold compresses to help with your symptoms.   You were given a prescription for Robaxin which is a muscle relaxer.  You should not drive, work, or operate machinery while taking this medication as it can make you very drowsy.    You were also given a prescription for metformin for your blood sugar and amlodipine for your blood pressure. Take as directed.   Please follow up with your primary care provider within 5-7 days for re-evaluation of your symptoms. If you do not have a primary care provider, information for a healthcare clinic has been provided for you to make arrangements for follow up care. Please return to the emergency department for any new or worsening symptoms.

## 2020-03-05 ENCOUNTER — Encounter (INDEPENDENT_AMBULATORY_CARE_PROVIDER_SITE_OTHER): Payer: Self-pay | Admitting: Primary Care

## 2020-03-05 ENCOUNTER — Other Ambulatory Visit: Payer: Self-pay

## 2020-03-05 ENCOUNTER — Ambulatory Visit (INDEPENDENT_AMBULATORY_CARE_PROVIDER_SITE_OTHER): Payer: Medicaid Other | Admitting: Primary Care

## 2020-03-05 VITALS — BP 161/108 | HR 111 | Temp 98.7°F | Resp 93 | Wt 271.4 lb

## 2020-03-05 DIAGNOSIS — E119 Type 2 diabetes mellitus without complications: Secondary | ICD-10-CM | POA: Diagnosis not present

## 2020-03-05 DIAGNOSIS — M542 Cervicalgia: Secondary | ICD-10-CM

## 2020-03-05 DIAGNOSIS — Z7689 Persons encountering health services in other specified circumstances: Secondary | ICD-10-CM

## 2020-03-05 DIAGNOSIS — I10 Essential (primary) hypertension: Secondary | ICD-10-CM | POA: Diagnosis not present

## 2020-03-05 LAB — POCT GLYCOSYLATED HEMOGLOBIN (HGB A1C): Hemoglobin A1C: 13.8 % — AB (ref 4.0–5.6)

## 2020-03-05 MED ORDER — SITAGLIPTIN PHOSPHATE 100 MG PO TABS: 100.0000 mg | ORAL_TABLET | Freq: Every day | ORAL | 1 refills | Status: DC

## 2020-03-05 MED ORDER — METFORMIN HCL ER (OSM) 1000 MG PO TB24: 1000.0000 mg | ORAL_TABLET | Freq: Two times a day (BID) | ORAL | 1 refills | Status: DC

## 2020-03-05 MED ORDER — CYCLOBENZAPRINE HCL 10 MG PO TABS: 10.0000 mg | ORAL_TABLET | Freq: Three times a day (TID) | ORAL | 1 refills | Status: DC | PRN

## 2020-03-05 MED ORDER — LISINOPRIL 20 MG PO TABS: 20.0000 mg | ORAL_TABLET | Freq: Every day | ORAL | 3 refills | Status: DC

## 2020-03-05 MED ORDER — HYDROCHLOROTHIAZIDE 25 MG PO TABS: 25.0000 mg | ORAL_TABLET | Freq: Every day | ORAL | 3 refills | Status: DC

## 2020-03-05 MED ORDER — LANTUS SOLOSTAR 100 UNIT/ML ~~LOC~~ SOPN: 20.0000 [IU] | PEN_INJECTOR | Freq: Every day | SUBCUTANEOUS | 99 refills | Status: DC

## 2020-03-05 MED ORDER — AMLODIPINE BESYLATE 10 MG PO TABS: 10.0000 mg | ORAL_TABLET | Freq: Every day | ORAL | 3 refills | Status: DC

## 2020-03-05 NOTE — Patient Instructions (Addendum)
Cervical Radiculopathy  Cervical radiculopathy means that a nerve in the neck (a cervical nerve) is pinched or bruised. This can happen because of an injury to the cervical spine (vertebrae) in the neck, or as a normal part of getting older. This can cause pain or loss of feeling (numbness) that runs from your neck all the way down to your arm and fingers. Often, this condition gets better with rest. Treatment may be needed if the condition does not get better. What are the causes?  A neck injury.  A bulging disk in your spine.  Muscle movements that you cannot control (muscle spasms).  Tight muscles in your neck due to overuse.  Arthritis.  Breakdown in the bones and joints of the spine (spondylosis) due to getting older.  Bone spurs that form near the nerves in the neck. What are the signs or symptoms?  Pain. The pain may: ? Run from the neck to the arm and hand. ? Be very bad or irritating. ? Be worse when you move your neck.  Loss of feeling or tingling in your arm or hand.  Weakness in your arm or hand, in very bad cases. How is this treated? In many cases, treatment is not needed for this condition. With rest, the condition often gets better over time. If treatment is needed, options may include:  Wearing a soft neck collar (cervical collar) for short periods of time, as told by your doctor.  Doing exercises (physical therapy) to strengthen your neck muscles.  Taking medicines.  Having shots (injections) in your spine, in very bad cases.  Having surgery. This may be needed if other treatments do not help. The type of surgery that is used depends on the cause of your condition. Follow these instructions at home: If you have a soft neck collar:  Wear it as told by your doctor. Remove it only as told by your doctor.  Ask your doctor if you can remove the collar for cleaning and bathing. If you are allowed to remove the collar for cleaning or bathing: ? Follow  instructions from your doctor about how to remove the collar safely. ? Clean the collar by wiping it with mild soap and water and drying it completely. ? Take out any removable pads in the collar every 1-2 days. Wash them by hand with soap and water. Let them air-dry completely before you put them back in the collar. ? Check your skin under the collar for redness or sores. If you see any, tell your doctor. Managing pain  Take over-the-counter and prescription medicines only as told by your doctor.  If told, put ice on the painful area. ? If you have a soft neck collar, remove it as told by your doctor. ? Put ice in a plastic bag. ? Place a towel between your skin and the bag. ? Leave the ice on for 20 minutes, 2-3 times a day.  If using ice does not help, you can try using heat. Use the heat source that your doctor recommends, such as a moist heat pack or a heating pad. ? Place a towel between your skin and the heat source. ? Leave the heat on for 20-30 minutes. ? Remove the heat if your skin turns bright red. This is very important if you are unable to feel pain, heat, or cold. You may have a greater risk of getting burned.  You may try a gentle neck and shoulder rub (massage).      Activity    Rest as needed.  Return to your normal activities as told by your doctor. Ask your doctor what activities are safe for you.  Do exercises as told by your doctor or physical therapist.  Do not lift anything that is heavier than 10 lb (4.5 kg) until your doctor tells you that it is safe. General instructions  Use a flat pillow when you sleep.  Do not drive while wearing a soft neck collar. If you do not have a soft neck collar, ask your doctor if it is safe to drive while your neck heals.  Ask your doctor if the medicine prescribed to you requires you to avoid driving or using heavy machinery.  Do not use any products that contain nicotine or tobacco, such as cigarettes, e-cigarettes, and  chewing tobacco. These can delay healing. If you need help quitting, ask your doctor.  Keep all follow-up visits as told by your doctor. This is important. Contact a doctor if:  Your condition does not get better with treatment. Get help right away if:  Your pain gets worse and is not helped with medicine.  You lose feeling or feel weak in your hand, arm, face, or leg.  You have a high fever.  You have a stiff neck.  You cannot control when you poop or pee (have incontinence).  You have trouble with walking, balance, or talking. Summary  Cervical radiculopathy means that a nerve in the neck is pinched or bruised.  A nerve can get pinched from a bulging disk, arthritis, an injury to the neck, or other causes.  Symptoms include pain, tingling, or loss of feeling that goes from the neck into the arm or hand.  Weakness in your arm or hand can happen in very bad cases.  Treatment may include resting, wearing a soft neck collar, and doing exercises. You might need to take medicines for pain. In very bad cases, shots or surgery may be needed. This information is not intended to replace advice given to you by your health care provider. Make sure you discuss any questions you have with your health care provider. Document Revised: 11/23/2017 Document Reviewed: 11/23/2017 Elsevier Patient Education  2021 Elsevier Inc.  Diabetes Mellitus and Nutrition, Adult When you have diabetes, or diabetes mellitus, it is very important to have healthy eating habits because your blood sugar (glucose) levels are greatly affected by what you eat and drink. Eating healthy foods in the right amounts, at about the same times every day, can help you:  Control your blood glucose.  Lower your risk of heart disease.  Improve your blood pressure.  Reach or maintain a healthy weight. What can affect my meal plan? Every person with diabetes is different, and each person has different needs for a meal plan.  Your health care provider may recommend that you work with a dietitian to make a meal plan that is best for you. Your meal plan may vary depending on factors such as:  The calories you need.  The medicines you take.  Your weight.  Your blood glucose, blood pressure, and cholesterol levels.  Your activity level.  Other health conditions you have, such as heart or kidney disease. How do carbohydrates affect me? Carbohydrates, also called carbs, affect your blood glucose level more than any other type of food. Eating carbs naturally raises the amount of glucose in your blood. Carb counting is a method for keeping track of how many carbs you eat. Counting carbs is important to keep your blood  glucose at a healthy level, especially if you use insulin or take certain oral diabetes medicines. It is important to know how many carbs you can safely have in each meal. This is different for every person. Your dietitian can help you calculate how many carbs you should have at each meal and for each snack. How does alcohol affect me? Alcohol can cause a sudden decrease in blood glucose (hypoglycemia), especially if you use insulin or take certain oral diabetes medicines. Hypoglycemia can be a life-threatening condition. Symptoms of hypoglycemia, such as sleepiness, dizziness, and confusion, are similar to symptoms of having too much alcohol.  Do not drink alcohol if: ? Your health care provider tells you not to drink. ? You are pregnant, may be pregnant, or are planning to become pregnant.  If you drink alcohol: ? Do not drink on an empty stomach. ? Limit how much you use to:  0-1 drink a day for women.  0-2 drinks a day for men. ? Be aware of how much alcohol is in your drink. In the [place] one drink equals one 12 oz bottle of beer (355 mL), one 5 oz glass of wine (148 mL), or one 1 oz glass of hard liquor (44 mL). ? Keep yourself hydrated with water, diet soda, or unsweetened iced tea.  Keep in  mind that regular soda, juice, and other mixers may contain a lot of sugar and must be counted as carbs. What are tips for following this plan? Reading food labels  Start by checking the serving size on the "Nutrition Facts" label of packaged foods and drinks. The amount of calories, carbs, fats, and other nutrients listed on the label is based on one serving of the item. Many items contain more than one serving per package.  Check the total grams (g) of carbs in one serving. You can calculate the number of servings of carbs in one serving by dividing the total carbs by 15. For example, if a food has 30 g of total carbs per serving, it would be equal to 2 servings of carbs.  Check the number of grams (g) of saturated fats and trans fats in one serving. Choose foods that have a low amount or none of these fats.  Check the number of milligrams (mg) of salt (sodium) in one serving. Most people should limit total sodium intake to less than 2,300 mg per day.  Always check the nutrition information of foods labeled as "low-fat" or "nonfat." These foods may be higher in added sugar or refined carbs and should be avoided.  Talk to your dietitian to identify your daily goals for nutrients listed on the label. Shopping  Avoid buying canned, pre-made, or processed foods. These foods tend to be high in fat, sodium, and added sugar.  Shop around the outside edge of the grocery store. This is where you will most often find fresh fruits and vegetables, bulk grains, fresh meats, and fresh dairy. Cooking  Use low-heat cooking methods, such as baking, instead of high-heat cooking methods like deep frying.  Cook using healthy oils, such as olive, canola, or sunflower oil.  Avoid cooking with butter, cream, or high-fat meats. Meal planning  Eat meals and snacks regularly, preferably at the same times every day. Avoid going long periods of time without eating.  Eat foods that are high in fiber, such as  fresh fruits, vegetables, beans, and whole grains. Talk with your dietitian about how many servings of carbs you can eat at each  meal.  Eat 4-6 oz (112-168 g) of lean protein each day, such as lean meat, chicken, fish, eggs, or tofu. One ounce (oz) of lean protein is equal to: ? 1 oz (28 g) of meat, chicken, or fish. ? 1 egg. ?  cup (62 g) of tofu.  Eat some foods each day that contain healthy fats, such as avocado, nuts, seeds, and fish.   What foods should I eat? Fruits Berries. Apples. Oranges. Peaches. Apricots. Plums. Grapes. Mango. Papaya. Pomegranate. Kiwi. Cherries. Vegetables Lettuce. Spinach. Leafy greens, including kale, chard, collard greens, and mustard greens. Beets. Cauliflower. Cabbage. Broccoli. Carrots. Green beans. Tomatoes. Peppers. Onions. Cucumbers. Brussels sprouts. Grains Whole grains, such as whole-wheat or whole-grain bread, crackers, tortillas, cereal, and pasta. Unsweetened oatmeal. Quinoa. Brown or wild rice. Meats and other proteins Seafood. Poultry without skin. Lean cuts of poultry and beef. Tofu. Nuts. Seeds. Dairy Low-fat or fat-free dairy products such as milk, yogurt, and cheese. The items listed above may not be a complete list of foods and beverages you can eat. Contact a dietitian for more information. What foods should I avoid? Fruits Fruits canned with syrup. Vegetables Canned vegetables. Frozen vegetables with butter or cream sauce. Grains Refined white flour and flour products such as bread, pasta, snack foods, and cereals. Avoid all processed foods. Meats and other proteins Fatty cuts of meat. Poultry with skin. Breaded or fried meats. Processed meat. Avoid saturated fats. Dairy Full-fat yogurt, cheese, or milk. Beverages Sweetened drinks, such as soda or iced tea. The items listed above may not be a complete list of foods and beverages you should avoid. Contact a dietitian for more information. Questions to ask a health care  provider  Do I need to meet with a diabetes educator?  Do I need to meet with a dietitian?  What number can I call if I have questions?  When are the best times to check my blood glucose? Where to find more information:  American Diabetes Association: diabetes.org  Academy of Nutrition and Dietetics: www.eatright.[employer] of Diabetes and Digestive and Kidney Diseases: CarFlippers.tn  Association of Diabetes Care and Education Specialists: www.diabeteseducator.org Summary  It is important to have healthy eating habits because your blood sugar (glucose) levels are greatly affected by what you eat and drink.  A healthy meal plan will help you control your blood glucose and maintain a healthy lifestyle.  Your health care provider may recommend that you work with a dietitian to make a meal plan that is best for you.  Keep in mind that carbohydrates (carbs) and alcohol have immediate effects on your blood glucose levels. It is important to count carbs and to use alcohol carefully. This information is not intended to replace advice given to you by your health care provider. Make sure you discuss any questions you have with your health care provider. Document Revised: 12/10/2018 Document Reviewed: 12/10/2018 Elsevier Patient Education  2021 [employer].

## 2020-03-05 NOTE — Progress Notes (Signed)
HPI [name] 39 y.o.male presents for follow up from the Emergency room on as 02/15/20, for pain to the right arm and right side of the neck after an injury that occurred 2 1/2 weeks ago. Reports he had door opened in his face and heard something in his neck pop upon impact.He reported swelling to the right side of his neck and intermittent pain to this area. He is HOH in his left ear. Establishing care and the management of HTN Denies shortness of breath, headaches, chest pain or lower extremity edema and Type 2 Diabetes -DIABETES, Hypoglycemic episodes:no, Polydipsia/polyuria: no, Visual disturbance: yes, Chest pain: no, Paresthesias: no, Glucose Monitoring: no   Past Medical History:  Diagnosis Date  . Diabetes mellitus without complication (HCC)   . Hypertension      No Known Allergies    Current Outpatient Medications on File Prior to Visit  Medication Sig Dispense Refill  . metFORMIN (GLUCOPHAGE-XR) 500 MG 24 hr tablet Take 1 tablet (500 mg total) by mouth daily with breakfast. 30 tablet 0   No current facility-administered medications on file prior to visit.    ROS: all negative except above.   Physical Exam: Filed Weights   03/05/20 0932  Weight: 271 lb 6.4 oz (123.1 kg)   BP (!) 161/108 (BP Location: Left Arm, Patient Position: Sitting, Cuff Size: Large)   Pulse (!) 111   Temp 98.7 F (37.1 C) (Oral)   Resp (!) 93   Wt 271 lb 6.4 oz (123.1 kg)   BMI 45.16 kg/m  General Appearance: Well nourished,morbid obesity  in no apparent distress. Eyes: PERRLA, EOMs, conjunctiva no swelling or erythema Sinuses: No Frontal/maxillary tenderness ENT/Mouth: Ext aud canals clear, TMs without erythema, bulging.  Hard of Hearing left ear  Neck: Supple, thyroid normal. Bilateral swelling stiffness and pain  Respiratory: Respiratory effort normal, BS equal bilaterally without rales, rhonchi, wheezing or stridor.  Cardio: RRR with no MRGs. Brisk peripheral pulses without  edema.  Abdomen: Soft, + BS.  Non tender, no guarding, rebound, hernias, masses. Lymphatics: Non tender without lymphadenopathy.  Musculoskeletal: Full ROM, 5/5 strength, normal gait.  Skin: Warm, dry without rashes, lesions, ecchymosis.  Neuro: Cranial nerves intact. Normal muscle tone, no cerebellar symptoms. Sensation intact.  Psych: Awake and oriented X 3, normal affect, Insight and Judgment appropriate.    Reco was seen today for hospitalization follow-up.  Diagnoses and all orders for this visit:  Essential hypertension Counseled on blood pressure goal of less than 130/80, low-sodium, DASH diet, medication compliance, 150 minutes of moderate intensity exercise per week. Discussed medication compliance, adverse effects. -     amLODipine (NORVASC) 10 MG tablet; Take 1 tablet (10 mg total) by mouth daily. -     hydrochlorothiazide (HYDRODIURIL) 25 MG tablet; Take 1 tablet (25 mg total) by mouth daily. -     lisinopril (ZESTRIL) 20 MG tablet; Take 1 tablet (20 mg total) by mouth daily.  Encounter to establish care Establish care with New provider   Type 2 diabetes mellitus without complication, without long-term current use of insulin (HCC) Discussed  co- morbidities with uncontrol diabetes  Complications -diabetic retinopathy, (close your eyes ? What do you see nothing) nephropathy decrease in kidney function- can lead to dialysis-on a machine 3 days a week to filter your kidney, neuropathy- numbness and tinging in your hands and feet,  increase risk of heart attack and stroke, and amputation due to decrease wound healing and circulation. Decrease your risk  by taking medication daily as prescribed, monitor carbohydrates- foods that are high in carbohydrates are the following rice, potatoes, breads, sugars, and pastas.  Reduction in the intake (eating) will assist in lowering your blood sugars. Exercise daily at least 30 minutes daily. -     HgB A1c -     Ambulatory referral to  Ophthalmology -     metformin (FORTAMET) 1000 MG (OSM) 24 hr tablet; Take 1 tablet (1,000 mg total) by mouth 2 (two) times daily with a meal. -     sitaGLIPtin (JANUVIA) 100 MG tablet; Take 1 tablet (100 mg total) by mouth daily. -     insulin glargine (LANTUS SOLOSTAR) 100 UNIT/ML Solostar Pen; Inject 20 Units into the skin daily.  Morbid obesity (HCC) Morbid Obesity is > 40  indicating an excess in caloric intake or underlining conditions. This may lead to other co-morbidities.( HTN, respiratory complications)  Lifestyle modifications of diet and exercise may reduce obesity.   Cervicalgia -     Ambulatory referral to Orthopedic Surgery  Other orders -     cyclobenzaprine (FLEXERIL) 10 MG tablet; Take 1 tablet (10 mg total) by mouth 3 (three) times daily as needed for muscle spasms.

## 2020-03-09 ENCOUNTER — Telehealth (INDEPENDENT_AMBULATORY_CARE_PROVIDER_SITE_OTHER): Payer: Self-pay | Admitting: Primary Care

## 2020-03-09 NOTE — Telephone Encounter (Signed)
PT stated he cannot afford the brand name prescriptions and asked if generics or other options can be sent in. PT stated [facility] has their own generic brands available at a lower cost.    metformin (FORTAMET) 1000 MG (OSM) 24 hr tablet, needs it to be 500MG  tablets so her insurance covers it.  insulin glargine (LANTUS SOLOSTAR) 100 UNIT/ML Solostar Pen - needs the generic / [facility] brand.  sitaGLIPtin (JANUVIA) 100 MG tablet  - Requested generic option.   Pharmacy: [facility] [address], [address] - [address] Phone:  [phone]  Fax:  [phone]

## 2020-03-10 NOTE — Telephone Encounter (Signed)
Sent to PCP ?

## 2020-03-10 NOTE — Telephone Encounter (Signed)
Patient has made additional contact regarding the prescription issue Patient would like to be switched to generic more cost effective medications  Please contact to advise

## 2020-03-11 ENCOUNTER — Ambulatory Visit (INDEPENDENT_AMBULATORY_CARE_PROVIDER_SITE_OTHER): Payer: Self-pay | Admitting: *Deleted

## 2020-03-11 NOTE — Telephone Encounter (Signed)
Sent to PCP again.

## 2020-03-11 NOTE — Telephone Encounter (Signed)
Pt was not a triage call.  He mentioned his vision was affected by his diabetes.   He told me his glucose was 900 when he was admitted to the hospital and his vision was affected from the diabetes.   No changes at present.  He needs the generic form of his medications, the metformin and insulin.   He can't afford what was prescribed.    If Gwinda Passe can prescribe generics it will be $1,000 cheaper.  [facility] has faxed an order with this information on it to [facility].  Pt is out of his medications and needs them ASAP.  I sent my notes to [facility] high priority so they would have it when the office opens from lunch.   Reason for Disposition . [1] Prescription refill request for ESSENTIAL medicine (i.e., likelihood of harm to patient if not taken) AND [2] triager unable to refill per department policy  Answer Assessment - Initial Assessment Questions 1. DRUG NAME: "What medicine do you need to have refilled?"     Metformin and insulin in generics 2. REFILLS REMAINING: "How many refills are remaining?" (Note: The label on the medicine or pill bottle will show how many refills are remaining. If there are no refills remaining, then a renewal may be needed.)     He is out of medication and needs it ASAP 3. EXPIRATION DATE: "What is the expiration date?" (Note: The label states when the prescription will expire, and thus can no longer be refilled.)     N/A 4. PRESCRIBING HCP: "Who prescribed it?" Reason: If prescribed by specialist, call should be referred to that group.     Gwinda Passe 5. SYMPTOMS: "Do you have any symptoms?"     No just don't want my sugar to get high 6. PREGNANCY: "Is there any chance that you are pregnant?" "When was your last menstrual period?"     N/A  Protocols used: MEDICATION REFILL AND RENEWAL CALL-A-AH

## 2020-03-12 ENCOUNTER — Other Ambulatory Visit: Payer: Self-pay | Admitting: Primary Care

## 2020-03-12 ENCOUNTER — Other Ambulatory Visit (INDEPENDENT_AMBULATORY_CARE_PROVIDER_SITE_OTHER): Payer: Self-pay | Admitting: Primary Care

## 2020-03-12 DIAGNOSIS — E119 Type 2 diabetes mellitus without complications: Secondary | ICD-10-CM

## 2020-03-12 MED ORDER — METFORMIN HCL 1000 MG PO TABS: 1000.0000 mg | ORAL_TABLET | Freq: Two times a day (BID) | ORAL | 3 refills | Status: DC

## 2020-03-12 MED ORDER — METFORMIN HCL ER (OSM) 1000 MG PO TB24: ORAL_TABLET | ORAL | 1 refills | Status: DC

## 2020-03-12 MED ORDER — BASAGLAR KWIKPEN 100 UNIT/ML ~~LOC~~ SOPN: 20.0000 [IU] | PEN_INJECTOR | Freq: Every day | SUBCUTANEOUS | 6 refills | Status: DC

## 2020-03-15 NOTE — Telephone Encounter (Signed)
I called pt and informed him of the message from Gwinda Passe, NP regarding the Lantus being changed to Basaglar and metformin changed too.   I let him know there is not a generic for the Januvia.  He has an eye dr. Appt made.   I let him know if he had any further problems to call us back.    He thanked [place] very much for all of our help.

## 2020-03-16 ENCOUNTER — Ambulatory Visit: Payer: Medicaid - Out of State | Admitting: Family Medicine

## 2020-03-22 ENCOUNTER — Other Ambulatory Visit: Payer: Self-pay

## 2020-03-22 ENCOUNTER — Ambulatory Visit (INDEPENDENT_AMBULATORY_CARE_PROVIDER_SITE_OTHER): Payer: Self-pay

## 2020-03-22 ENCOUNTER — Ambulatory Visit (INDEPENDENT_AMBULATORY_CARE_PROVIDER_SITE_OTHER): Payer: Self-pay | Admitting: Family Medicine

## 2020-03-22 DIAGNOSIS — M542 Cervicalgia: Secondary | ICD-10-CM

## 2020-03-22 DIAGNOSIS — M79601 Pain in right arm: Secondary | ICD-10-CM

## 2020-03-22 MED ORDER — BACLOFEN 10 MG PO TABS: 5.0000 mg | ORAL_TABLET | Freq: Three times a day (TID) | ORAL | 3 refills | Status: AC | PRN

## 2020-03-22 MED ORDER — CELECOXIB 200 MG PO CAPS: 200.0000 mg | ORAL_CAPSULE | Freq: Two times a day (BID) | ORAL | 6 refills | Status: AC | PRN

## 2020-03-22 NOTE — Progress Notes (Signed)
Office Visit Note   Patient: David Eaton           Date of Birth: 11/08/1980           MRN: [id number] Visit Date: 03/22/2020 Requested by: Grayce Sessions, NP [id number] [place],  [place] [address] PCP: Patient, No Pcp Per  Subjective: Chief Complaint  Patient presents with  . Neck - Pain    Swollen area anterior neck, to the right side x 6 weeks - injured when his arm got stuck behind a door at [employer] ----he was a Financial trader. Weakness and pain in the forearm.    HPI: He is here with neck and right arm pain.  About 6 weeks ago he was inside the bathroom at [facility], he had his right forearm hooked around a door handle to hold the door open for somebody coming into the bathroom.  The person did not know he was behind the door and pushed the door open forcing him against the wall.  This caused his neck to bend laterally to the left and he felt something pop.  He has had pain in his neck and right forearm since then.  He went to the ER at the end of January and had a CT angiogram of his neck which was negative for arterial or other acute abnormality.  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  No previous problems with his neck or right arm.               ROS:   All other systems were reviewed and are negative.  Objective: Vital Signs: There were no vitals taken for this visit.  Physical Exam:  General:  Alert and oriented, in no acute distress. Pulm:  Breathing unlabored. Psy:  Normal mood, congruent affect.  Neck: He has pain at the extremes of range of motion.  He has tenderness to palpation in the right cervical paraspinous muscles at the lower levels, and in the anterior lateral neck muscles.  He has 5/5 upper extremity strength.  His forearm is tender on the volar aspect in the muscle belly.   Imaging: XR Cervical Spine 2 or 3 views  Result Date: 03/22/2020 X-rays  cervical spine reveal early degenerative disc disease at C6-7.  Alignment is otherwise anatomic, no acute abnormality seen.  XR Forearm Right  Result Date: 03/22/2020 X-rays right forearm reveal no definite acute fracture.  There is a probable osteophyte in the elbow, cannot rule out loose body.   Assessment & Plan: 1.  Approximately 6 weeks status post injury resulting in neck and right arm pain.  Question cervical disc protrusion.  Cannot rule out right elbow ligament injury. -We will try physical therapy, baclofen and Celebrex as needed.  If not improving then cervical and/or right elbow MRI scan.     Procedures: No procedures performed        PMFS History: There are no problems to display for this patient.  Past Medical History:  Diagnosis Date  . Diabetes mellitus without complication (HCC)   . Hypertension     No family history on file.  No past surgical history on file. Social History   Occupational History  . Not on file  Tobacco Use  . Smoking status: Current Every Day Smoker    Types: Cigarettes  . Smokeless tobacco: Never Used  Vaping Use  .  Vaping Use: Never used  Substance and Sexual Activity  . Alcohol use: Not Currently  . Drug use: Not Currently  . Sexual activity: Not on file

## 2020-03-24 MED FILL — ?BASAGLAR 100 UNITS/ML KWPE: 100 | 30 days supply | Qty: 6 | Fill #0

## 2020-04-07 ENCOUNTER — Ambulatory Visit (HOSPITAL_COMMUNITY): Payer: Medicaid - Out of State | Attending: Family Medicine | Admitting: Physical Therapy

## 2020-04-07 DIAGNOSIS — M542 Cervicalgia: Secondary | ICD-10-CM | POA: Insufficient documentation

## 2020-04-07 DIAGNOSIS — M79601 Pain in right arm: Secondary | ICD-10-CM | POA: Insufficient documentation

## 2020-04-08 ENCOUNTER — Other Ambulatory Visit: Payer: Self-pay

## 2020-04-08 ENCOUNTER — Encounter (HOSPITAL_COMMUNITY): Payer: Self-pay | Admitting: Physical Therapy

## 2020-04-08 ENCOUNTER — Ambulatory Visit (HOSPITAL_COMMUNITY): Payer: Medicaid - Out of State | Admitting: Physical Therapy

## 2020-04-08 DIAGNOSIS — M542 Cervicalgia: Secondary | ICD-10-CM | POA: Diagnosis not present

## 2020-04-08 DIAGNOSIS — M79601 Pain in right arm: Secondary | ICD-10-CM | POA: Diagnosis present
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
## 2020-04-09 ENCOUNTER — Ambulatory Visit (INDEPENDENT_AMBULATORY_CARE_PROVIDER_SITE_OTHER): Payer: Medicaid - Out of State | Admitting: Primary Care

## 2020-04-14 ENCOUNTER — Telehealth (INDEPENDENT_AMBULATORY_CARE_PROVIDER_SITE_OTHER): Payer: Self-pay

## 2020-04-14 NOTE — Telephone Encounter (Signed)
Copied from CRM [id number]. Topic: Referral - Request for Referral >> Apr 14, 2020  9:42 AM Gwenlyn Fudge wrote: Has patient seen PCP for this complaint? Yes.   *If NO, is insurance requiring patient see PCP for this issue before PCP can refer them? Referral for which specialty: Opthalmology  Preferred provider/office: N/A in [place] Reason for referral: Pt calling and is requesting to have a referral for a [place] office instead of in [place]. Please advise .

## 2020-04-19 ENCOUNTER — Encounter (INDEPENDENT_AMBULATORY_CARE_PROVIDER_SITE_OTHER): Payer: Self-pay | Admitting: Primary Care

## 2020-04-19 ENCOUNTER — Ambulatory Visit (INDEPENDENT_AMBULATORY_CARE_PROVIDER_SITE_OTHER): Payer: Medicaid Other | Admitting: Primary Care

## 2020-04-19 ENCOUNTER — Other Ambulatory Visit: Payer: Self-pay

## 2020-04-19 VITALS — BP 146/89 | HR 100 | Temp 97.9°F | Ht 65.0 in | Wt 273.4 lb

## 2020-04-19 DIAGNOSIS — M542 Cervicalgia: Secondary | ICD-10-CM

## 2020-04-19 DIAGNOSIS — I10 Essential (primary) hypertension: Secondary | ICD-10-CM

## 2020-04-19 MED ORDER — CARVEDILOL 12.5 MG PO TABS: 12.5000 mg | ORAL_TABLET | Freq: Two times a day (BID) | ORAL | 3 refills | Status: DC

## 2020-04-19 NOTE — Patient Instructions (Signed)

## 2020-04-19 NOTE — Progress Notes (Signed)
[facility]   Mr. Breland Trouten is a 40 year old morbid obese male who presents for hypertension evaluation, on previous visit medication was adjusted to include increased amlodipine from 5 mg to 10 mg continued lisinopril 20 mg and HCTZ 25 mg  Patient reports adherence with medications. Cervical Radiculopathy followed by ortho Dr. Prince Rome and in physical therapy. Pain level 10/10 . Continues to have pain refer/defer to Dr. Prince Rome.  Current Medication List There are no diagnoses linked to this encounter. Past Medical History  Past Medical History:  Diagnosis Date  . Diabetes mellitus without complication (HCC)   . Hypertension    Dietary habits include: low sodium/carbs trying  Exercise habits include yes walks daily  Family / Social history: Partneral father CVA ASCVD risk factors include- [place]  O:  Physical Exam Vitals reviewed.  Constitutional:      Appearance: He is obese.     Comments: morbid  HENT:     Head: Normocephalic.     Right Ear: External ear normal.     Left Ear: External ear normal.     Nose: Nose normal.  Eyes:     Extraocular Movements: Extraocular movements intact.  Cardiovascular:     Rate and Rhythm: Normal rate and regular rhythm.  Pulmonary:     Effort: Pulmonary effort is normal.     Breath sounds: Normal breath sounds.  Abdominal:     General: Bowel sounds are normal. There is distension.     Palpations: Abdomen is soft.  Musculoskeletal:        General: Normal range of motion.     Cervical back: Rigidity present.  Skin:    General: Skin is warm and dry.  Neurological:     Mental Status: He is alert and oriented to person, place, and time.  Psychiatric:        Mood and Affect: Mood normal.        Behavior: Behavior normal.        Thought Content: Thought content normal.        Judgment: Judgment normal.      ROS  Last 3 Office BP readings: BP Readings from Last 3 Encounters:  03/05/20 (!) 161/108  02/15/20 (!)  165/119  02/13/20 (!) 185/106    BMET    Component Value Date/Time   NA 126 (L) 02/15/2020 1159   K 4.1 02/15/2020 1159   CL 98 02/15/2020 1159   CO2 20 (L) 02/15/2020 1159   GLUCOSE 425 (H) 02/15/2020 1159   BUN 22 (H) 02/15/2020 1159   CREATININE 1.35 (H) 02/15/2020 1159   CALCIUM 9.8 02/15/2020 1159   GFRNONAA >60 02/15/2020 1159    Renal function: CrCl cannot be calculated (Patient's most recent lab result is older than the maximum 21 days allowed.).  Clinical ASCVD: No  The ASCVD Risk score Denman George DC Jr., et al., 2013) failed to calculate for the following reasons:   The 2013 ASCVD risk score is only valid for ages 40 to 38   A/P: Zymeir was seen today for blood pressure check.  Diagnoses and all orders for this visit:   Cervicalgia Constant pain defer to ortho question if needs pain management referral   Essential hypertension -     carvedilol (COREG) 12.5 MG tablet; Take 1 tablet (12.5 mg total) by mouth 2 (two) times daily with a meal. Hypertension longstanding diagnosed currently amlodipine 10mg , HCTZ 25mg  and lisinopril 10mg   on current medications. BP Goal = 130/80 mmHg. Patient is  adherent with current medications.  -Added Coreg 12.5 bid  -F/u labs ordered -none  -Counseled on lifestyle modifications for blood pressure control including reduced dietary sodium, increased exercise, adequate sleep  Grayce Sessions

## 2020-04-20 ENCOUNTER — Ambulatory Visit (HOSPITAL_COMMUNITY): Payer: Medicaid - Out of State

## 2020-04-21 ENCOUNTER — Encounter (HOSPITAL_COMMUNITY): Payer: Self-pay | Admitting: Physical Therapy

## 2020-04-21 ENCOUNTER — Other Ambulatory Visit: Payer: Self-pay

## 2020-04-21 ENCOUNTER — Ambulatory Visit (HOSPITAL_COMMUNITY): Payer: Medicaid - Out of State | Attending: Family Medicine | Admitting: Physical Therapy

## 2020-04-21 DIAGNOSIS — M79601 Pain in right arm: Secondary | ICD-10-CM | POA: Diagnosis present

## 2020-04-21 DIAGNOSIS — M542 Cervicalgia: Secondary | ICD-10-CM | POA: Insufficient documentation

## 2020-04-21 NOTE — Therapy (Signed)
[facility] [address] [place], [place], [address] Phone: [phone]   Fax:  [phone]  Physical Therapy Treatment  Patient Details  Name: David Eaton MRN: [id number] Date of Birth: 02-20-1980 Referring Provider (PT): Lavada Mesi MD   Encounter Date: 04/21/2020   PT End of Session - 04/21/20 1445    Visit Number 2    Number of Visits 8    Date for PT Re-Evaluation 05/06/20    Authorization Type Self Pay    PT Start Time 1432    PT Stop Time 1515    PT Time Calculation (min) 43 min    Activity Tolerance Patient tolerated treatment well    Behavior During Therapy [facility] for tasks assessed/performed;Restless           Past Medical History:  Diagnosis Date  . Diabetes mellitus without complication (HCC)   . Hypertension     History reviewed. No pertinent surgical history.  There were no vitals filed for this visit.   Subjective Assessment - 04/21/20 1439    Subjective Patient says he has been having a lot of pain. He says he had recent MD visit for his blood pressure which has been elevated. He is describes being concerned that his pain had not been addressed with medication. He has been doing HEP which he says helped. He has tried neck pillow, but feels it may be too small.    Limitations Lifting;Walking;Sitting;House hold activities    Diagnostic tests xrays    Patient Stated Goals "make this stop"    Currently in Pain? Yes    Pain Score 8     Pain Location Neck    Pain Orientation Right    Pain Descriptors / Indicators Aching    Pain Type Acute pain    Pain Onset More than a month ago    Pain Frequency Constant                             [facility] Adult PT Treatment/Exercise - 04/21/20 0001      Neck Exercises: Supine   Neck Retraction 15 reps    Other Supine Exercise scapular retraction 15x    Other Supine Exercise supine punches x15      Manual Therapy   Manual Therapy Soft tissue  mobilization    Manual therapy comments Performed separate from all other activity    Soft tissue mobilization IASTM to RT SCM wiht patient in supine                    PT Short Term Goals - 04/08/20 1417      PT SHORT TERM GOAL #1   Title Patient will be independent with initial HEP and self-management strategies to improve functional outcomes    Time 2    Period Weeks    Status New    Target Date 04/22/20             PT Long Term Goals - 04/08/20 1417      PT LONG TERM GOAL #1   Title Patient will report at least 75% overall improvement in subjective complaint to indicate improvement in ability to perform ADLs.    Time 4    Period Weeks    Status New    Target Date 05/06/20      PT LONG TERM GOAL #2   Title Patient improve bilateral cervical rotation to at least 50  degrees in order to improve ability to scan environment for safety and while driving.    Time 4    Period Weeks    Status New    Target Date 05/06/20      PT LONG TERM GOAL #3   Title Patient will have restored RT shoulder AROM in order to reach overhead pain free to perform household chores and work tasks    Time 4    Period Weeks    Status New    Target Date 05/06/20      PT LONG TERM GOAL #4   Title Patient will report average neck pain no greater than 3/10 for improved quality of life and ability to perform ADLs and work tasks    Time 4    Period Weeks    Status New    Target Date 05/06/20                 Plan - 04/21/20 1454    Clinical Impression Statement Patient continues to present in restless manner, frequently twisting neck and writhing to find comfortable position. Added supine strengthening exercise for supporting cervical spine. Educated patient on avoiding excessive jerking of neck and constantly attempting to "crack it/ make it pop", as this is likely contributing to discomfort. Performed STM to RT SCM for muscle restriction and pain relief. Noted palpable trigger  points. Patient notes decreased pain post treatment. Issued updated HEP. Patient will continue to benefit from skilled therapy for reduced pain and improved symptoms.    Examination-Activity Limitations Caring for Others;Carry;Reach Overhead;Transfers;Lift;Bathing;Dressing;Sleep;Hygiene/Grooming    Examination-Participation Restrictions Occupation;Community Activity;Driving;Yard Work;Cleaning    Stability/Clinical Decision Making Stable/Uncomplicated    Rehab Potential Good    PT Frequency 2x / week    PT Duration 4 weeks    PT Treatment/Interventions ADLs/Self Care Home Management;Aquatic Therapy;Biofeedback;Fluidtherapy;Parrafin;Patient/family education;Therapeutic activities;Functional mobility training;Manual techniques;Manual lymph drainage;Energy conservation;Splinting;Taping;Compression bandaging;Vasopneumatic Device;Orthotic Fit/Training;Therapeutic exercise;Contrast Bath;Cryotherapy;Electrical Stimulation;DME Instruction;Balance training;Neuromuscular re-education;Passive range of motion;Scar mobilization;Gait Network engineer;Iontophoresis 4mg /ml Dexamethasone;Moist Heat;Traction;Ultrasound;Dry needling;Visual/perceptual remediation/compensation;Spinal Manipulations;Joint Manipulations;Other (comment)    PT Next Visit Plan Continue manuals. Add band strength when ready.    PT Home Exercise Plan Eval: shoulder rolls, cervical rotation AROM, f/u on neck pillow 4/6 supine chin tuck, scap retraction, supine punches    Consulted and Agree with Plan of Care Patient           Patient will benefit from skilled therapeutic intervention in order to improve the following deficits and impairments:  Pain,Improper body mechanics,Impaired sensation,Increased fascial restricitons,Decreased activity tolerance,Decreased range of motion,Decreased strength,Hypomobility,Impaired UE functional use,Impaired perceived functional ability,Postural dysfunction,Impaired flexibility  Visit  Diagnosis: Cervicalgia  Pain in right arm     Problem List There are no problems to display for this patient.   4:48 PM, 04/21/20 06/21/20 PT DPT  Physical Therapist with [facility]  [facility]  [phone]   [facility] [address] [place], [place], [place] Phone: [phone]   Fax:  [phone]  Name: David Eaton MRN: Sarina Ill Date of Birth: [birth year]

## 2020-04-21 NOTE — Patient Instructions (Signed)
Access Code: [email or web address] URL: [email or web address] Date: 04/21/2020 Prepared by: Georges Lynch  Exercises Supine Cervical Retraction with Towel - 3 x daily - 7 x weekly - 2 sets - 10 reps Supine Scapular Retraction - 3 x daily - 7 x weekly - 2 sets - 10 reps Supine Bilateral Shoulder Protraction - 3 x daily - 7 x weekly - 2 sets - 10 reps

## 2020-04-22 ENCOUNTER — Ambulatory Visit (HOSPITAL_COMMUNITY): Payer: Medicaid - Out of State | Admitting: Physical Therapy

## 2020-04-22 ENCOUNTER — Encounter (HOSPITAL_COMMUNITY): Payer: Self-pay | Admitting: Physical Therapy

## 2020-04-22 ENCOUNTER — Other Ambulatory Visit: Payer: Self-pay

## 2020-04-22 DIAGNOSIS — M542 Cervicalgia: Secondary | ICD-10-CM | POA: Diagnosis not present

## 2020-04-22 DIAGNOSIS — M79601 Pain in right arm: Secondary | ICD-10-CM

## 2020-04-22 MED FILL — Insulin Glargine Soln Pen-Injector 100 Unit/ML: SUBCUTANEOUS | 30 days supply | Qty: 6 | Fill #0 | Status: AC

## 2020-04-22 NOTE — Therapy (Signed)
[facility] [address] [place], [place], [address] Phone: [phone]   Fax:  [phone]  Physical Therapy Treatment  Patient Details  Name: David Eaton MRN: [id number] Date of Birth: 07-19-1980 Referring Provider (PT): Lavada Mesi MD   Encounter Date: 04/22/2020   PT End of Session - 04/22/20 1539    Visit Number 3    Number of Visits 8    Date for PT Re-Evaluation 05/06/20    Authorization Type Self Pay    PT Start Time 1432    PT Stop Time 1512    PT Time Calculation (min) 40 min    Activity Tolerance Patient tolerated treatment well    Behavior During Therapy [facility] for tasks assessed/performed;Restless           Past Medical History:  Diagnosis Date  . Diabetes mellitus without complication (HCC)   . Hypertension     History reviewed. No pertinent surgical history.  There were no vitals filed for this visit.   Subjective Assessment - 04/22/20 1556    Subjective Patient says he is still having a lot of pain. He says he cannot stop turning his head and trying to crack his neck. He states pain is 10/10    Limitations Lifting;Walking;Sitting;House hold activities    Diagnostic tests xrays    Patient Stated Goals "make this stop"    Currently in Pain? Yes    Pain Score 10-Worst pain ever    Pain Location Neck    Pain Orientation Right    Pain Descriptors / Indicators Aching    Pain Type Acute pain    Pain Onset More than a month ago    Pain Frequency Constant                             [facility] Adult PT Treatment/Exercise - 04/22/20 0001      Neck Exercises: Seated   Neck Retraction 10 reps    Neck Retraction Limitations chin tuck with RT rotation x 10    Other Seated Exercise scap retraction x 10      Manual Therapy   Manual Therapy Joint mobilization;Manual Traction    Manual therapy comments Performed separate from all other activity    Joint Mobilization RT 1st rib mob sup/inf grade II  with test/retest of cervical rotation and RT shoulder flexion    Manual Traction 5 x 60" using towel                  PT Education - 04/22/20 1549    Education Details Spent significant time assessing patient response and pain description, educating patient on relevance and likely structures involved and how these are related to the pain he describes. Educated patient on plan of care and likely return to MD if pain remains intractable. Educated patient on purpose and relevance of added manual techniques and self care to decrease muscle strain and irritation.    Person(s) Educated Patient    Methods Explanation    Comprehension Verbalized understanding            PT Short Term Goals - 04/08/20 1417      PT SHORT TERM GOAL #1   Title Patient will be independent with initial HEP and self-management strategies to improve functional outcomes    Time 2    Period Weeks    Status New    Target Date 04/22/20  PT Long Term Goals - 04/08/20 1417      PT LONG TERM GOAL #1   Title Patient will report at least 75% overall improvement in subjective complaint to indicate improvement in ability to perform ADLs.    Time 4    Period Weeks    Status New    Target Date 05/06/20      PT LONG TERM GOAL #2   Title Patient improve bilateral cervical rotation to at least 50 degrees in order to improve ability to scan environment for safety and while driving.    Time 4    Period Weeks    Status New    Target Date 05/06/20      PT LONG TERM GOAL #3   Title Patient will have restored RT shoulder AROM in order to reach overhead pain free to perform household chores and work tasks    Time 4    Period Weeks    Status New    Target Date 05/06/20      PT LONG TERM GOAL #4   Title Patient will report average neck pain no greater than 3/10 for improved quality of life and ability to perform ADLs and work tasks    Time 4    Period Weeks    Status New    Target Date 05/06/20                  Plan - 04/22/20 1540    Clinical Impression Statement Patient with continued concern about neck pain. States symptoms remain intractable. He says STM helped to sooth the muscle but later feels stiff and hurts. His description of neck pain is somewhat difficult to follow. He remains limited in controlled RT cervical rotation but demos fairly good AROM considering the end ranges seen when he is twisting, turning, flexing, bending neck. He says he feels like he needs to be pulled up on by his head from the front of his neck. Trialed manual cervical traction. This improved symptoms. Also found RT 1st rib to be significantly elevated compared to LT. Mobilized this and saw improved RT shoulder AROM and reported pain reduction from 10/10 to 7/10 during session. Patient says he would like to continue therapy at this time. If pain remains intractable and no sustainable progress seen, will likely defer patient back to referring provider.    Examination-Activity Limitations Caring for Others;Carry;Reach Overhead;Transfers;Lift;Bathing;Dressing;Sleep;Hygiene/Grooming    Examination-Participation Restrictions Occupation;Community Activity;Driving;Yard Work;Cleaning    Stability/Clinical Decision Making Stable/Uncomplicated    Rehab Potential Good    PT Frequency 2x / week    PT Duration 4 weeks    PT Treatment/Interventions ADLs/Self Care Home Management;Aquatic Therapy;Biofeedback;Fluidtherapy;Parrafin;Patient/family education;Therapeutic activities;Functional mobility training;Manual techniques;Manual lymph drainage;Energy conservation;Splinting;Taping;Compression bandaging;Vasopneumatic Device;Orthotic Fit/Training;Therapeutic exercise;Contrast Bath;Cryotherapy;Electrical Stimulation;DME Instruction;Balance training;Neuromuscular re-education;Passive range of motion;Scar mobilization;Gait Network engineer;Iontophoresis 4mg /ml Dexamethasone;Moist Heat;Traction;Ultrasound;Dry  needling;Visual/perceptual remediation/compensation;Spinal Manipulations;Joint Manipulations;Other (comment)    PT Next Visit Plan Assess response to 1st rib mobs last visit. Continue if indicated. Add band strength when ready.    PT Home Exercise Plan Eval: shoulder rolls, cervical rotation AROM, f/u on neck pillow 4/6 supine chin tuck, scap retraction, supine punches    Consulted and Agree with Plan of Care Patient           Patient will benefit from skilled therapeutic intervention in order to improve the following deficits and impairments:  Pain,Improper body mechanics,Impaired sensation,Increased fascial restricitons,Decreased activity tolerance,Decreased range of motion,Decreased strength,Hypomobility,Impaired UE functional use,Impaired perceived functional ability,Postural dysfunction,Impaired flexibility  Visit Diagnosis: Cervicalgia  Pain in right arm     Problem List There are no problems to display for this patient.  3:57 PM, 04/22/20 Georges Lynch PT DPT  Physical Therapist with [facility]  [facility]  [phone]   [facility] [address] [place], [place], [address] Phone: [phone]   Fax:  [phone]  Name: Breion Novacek MRN: [id number] Date of Birth: [birth year]

## 2020-04-28 ENCOUNTER — Other Ambulatory Visit: Payer: Self-pay

## 2020-04-28 ENCOUNTER — Ambulatory Visit (HOSPITAL_COMMUNITY): Payer: Medicaid - Out of State

## 2020-04-28 ENCOUNTER — Encounter (HOSPITAL_COMMUNITY): Payer: Self-pay

## 2020-04-28 DIAGNOSIS — M542 Cervicalgia: Secondary | ICD-10-CM

## 2020-04-28 DIAGNOSIS — M79601 Pain in right arm: Secondary | ICD-10-CM

## 2020-04-28 NOTE — Therapy (Signed)
[facility] [address] [place], [place], [address] Phone: [phone]   Fax:  [phone]  Physical Therapy Treatment  Patient Details  Name: David Eaton MRN: [id number] Date of Birth: 06/03/1980 Referring Provider (PT): Lavada Mesi MD   Encounter Date: 04/28/2020   PT End of Session - 04/28/20 1758    Visit Number 4    Number of Visits 8    Date for PT Re-Evaluation 05/06/20    Authorization Type Self Pay    PT Start Time 1716   pt late for apt   PT Stop Time 1745    PT Time Calculation (min) 29 min    Activity Tolerance Patient tolerated treatment well;Patient limited by pain;No increased pain    Behavior During Therapy [facility] for tasks assessed/performed;Restless           Past Medical History:  Diagnosis Date  . Diabetes mellitus without complication (HCC)   . Hypertension     History reviewed. No pertinent surgical history.  There were no vitals filed for this visit.   Subjective Assessment - 04/28/20 1716    Subjective Pt stated he is constantly moving his head.  Stated therapy has not helped.  Current pain scale 9-10/10.  Has been compliant with HEP.  Reports some relief with STM and first rib mob following initially but increased stiffness.    Currently in Pain? Yes    Pain Score 10-Worst pain ever    Pain Location Neck    Pain Orientation Right    Pain Descriptors / Indicators Throbbing;Tender    Pain Type Acute pain    Pain Onset More than a month ago    Pain Frequency Constant    Aggravating Factors  laying flat, turning head    Pain Relieving Factors heat, meds    Effect of Pain on Daily Activities limits                             [facility] Adult PT Treatment/Exercise - 04/28/20 0001      Neck Exercises: Seated   Neck Retraction 5 reps;5 secs    Neck Retraction Limitations 3 sets.  Tactile cueing and mirror feedback to improve mechanics      Manual Therapy   Manual Therapy Soft  tissue mobilization    Manual therapy comments Performed separate from all other activity    Soft tissue mobilization STM to cervical mm, position release technqiue for Rt SCM x 2 min    Manual Traction 3x manual cervical traction                  PT Education - 04/28/20 1753    Education Details Educated on neck anatomy and repeated movements to increase irritation with SCM.  Educated on proper posture and improper posture can increased neck pain.  Encouraged pt not to hyperextend neck as will irritate SCM.    Person(s) Educated Patient    Methods Explanation    Comprehension Verbalized understanding            PT Short Term Goals - 04/08/20 1417      PT SHORT TERM GOAL #1   Title Patient will be independent with initial HEP and self-management strategies to improve functional outcomes    Time 2    Period Weeks    Status New    Target Date 04/22/20  PT Long Term Goals - 04/08/20 1417      PT LONG TERM GOAL #1   Title Patient will report at least 75% overall improvement in subjective complaint to indicate improvement in ability to perform ADLs.    Time 4    Period Weeks    Status New    Target Date 05/06/20      PT LONG TERM GOAL #2   Title Patient improve bilateral cervical rotation to at least 50 degrees in order to improve ability to scan environment for safety and while driving.    Time 4    Period Weeks    Status New    Target Date 05/06/20      PT LONG TERM GOAL #3   Title Patient will have restored RT shoulder AROM in order to reach overhead pain free to perform household chores and work tasks    Time 4    Period Weeks    Status New    Target Date 05/06/20      PT LONG TERM GOAL #4   Title Patient will report average neck pain no greater than 3/10 for improved quality of life and ability to perform ADLs and work tasks    Time 4    Period Weeks    Status New    Target Date 05/06/20                 Plan - 04/28/20 1758     Clinical Impression Statement Pt educated on importance of proper mechanics with chin tucks as tendency to extend neck then retract.  Educated on SCM anatomy and function of musculature.  Encouraged pt to stop wtih repeated neck movements as seems to be irritating SCM and to stop with hyperextension of neck as pulling muscle.  Used tactile, verbal and mirror feedback to improve cervical retraction with reports of relief with proper form.  EOS with STM for SCM, reports of relief following position release techniques and cervical traction.  Pt continues to report increased stiffness and pain following therapy, may need to return to MD prior end of POC if no improvements.    Examination-Activity Limitations Caring for Others;Carry;Reach Overhead;Transfers;Lift;Bathing;Dressing;Sleep;Hygiene/Grooming    Examination-Participation Restrictions Occupation;Community Activity;Driving;Yard Work;Cleaning    Stability/Clinical Decision Making Stable/Uncomplicated    Clinical Decision Making Low    Rehab Potential Good    PT Frequency 2x / week    PT Duration 4 weeks    PT Treatment/Interventions ADLs/Self Care Home Management;Aquatic Therapy;Biofeedback;Fluidtherapy;Parrafin;Patient/family education;Therapeutic activities;Functional mobility training;Manual techniques;Manual lymph drainage;Energy conservation;Splinting;Taping;Compression bandaging;Vasopneumatic Device;Orthotic Fit/Training;Therapeutic exercise;Contrast Bath;Cryotherapy;Electrical Stimulation;DME Instruction;Balance training;Neuromuscular re-education;Passive range of motion;Scar mobilization;Gait Network engineer;Iontophoresis 4mg /ml Dexamethasone;Moist Heat;Traction;Ultrasound;Dry needling;Visual/perceptual remediation/compensation;Spinal Manipulations;Joint Manipulations;Other (comment)    PT Next Visit Plan Assess response to 1st rib mobs last visit. Continue if indicated. Add band strength when ready.    PT Home Exercise Plan Eval:  shoulder rolls, cervical rotation AROM, f/u on neck pillow 4/6 supine chin tuck, scap retraction, supine punches    Consulted and Agree with Plan of Care Patient           Patient will benefit from skilled therapeutic intervention in order to improve the following deficits and impairments:  Pain,Improper body mechanics,Impaired sensation,Increased fascial restricitons,Decreased activity tolerance,Decreased range of motion,Decreased strength,Hypomobility,Impaired UE functional use,Impaired perceived functional ability,Postural dysfunction,Impaired flexibility  Visit Diagnosis: Cervicalgia  Pain in right arm     Problem List There are no problems to display for this patient.  , LPTA/CLT; CBIS [phone]  [phone] 04/28/2020,  6:07 PM  [facility] [address] [place], [place], [address] Phone: [phone]   Fax:  [phone]  Name: David Eaton MRN: [id number] Date of Birth: [birth year]

## 2020-05-04 ENCOUNTER — Ambulatory Visit (HOSPITAL_COMMUNITY): Payer: Medicaid - Out of State | Admitting: Physical Therapy

## 2020-05-04 ENCOUNTER — Other Ambulatory Visit: Payer: Self-pay

## 2020-05-04 DIAGNOSIS — M542 Cervicalgia: Secondary | ICD-10-CM | POA: Diagnosis not present

## 2020-05-04 DIAGNOSIS — M79601 Pain in right arm: Secondary | ICD-10-CM

## 2020-05-04 NOTE — Therapy (Signed)
[facility] [address] [place], [place], [address] Phone: [phone]   Fax:  [phone]  Physical Therapy Treatment  Patient Details  Name: David Eaton MRN: [id number] Date of Birth: 06-Dec-1980 Referring Provider (PT): Lavada Mesi MD   Encounter Date: 05/04/2020   PT End of Session - 05/04/20 1731    Visit Number 5    Number of Visits 8    Date for PT Re-Evaluation 05/06/20    Authorization Type Self Pay    PT Start Time 1540    PT Stop Time 1620    PT Time Calculation (min) 40 min    Activity Tolerance Patient tolerated treatment well;Patient limited by pain;No increased pain    Behavior During Therapy [facility] for tasks assessed/performed;Restless           Past Medical History:  Diagnosis Date  . Diabetes mellitus without complication (HCC)   . Hypertension     No past surgical history on file.  There were no vitals filed for this visit.   Subjective Assessment - 05/04/20 1545    Subjective pt states it's constant popping and tightness in the Rt anterior side of his neck.  Currently without pain just extreme tightness and feeling like he needs to pop it as well.  STates he hopes to have surgery to fix whatever is going on.    Pain Score 0-No pain                             [facility] Adult PT Treatment/Exercise - 05/04/20 0001      Neck Exercises: Seated   Neck Retraction 10 reps;5 secs    Neck Retraction Limitations 2 sets.  Tactile cueing and mirror feedback to improve mechanics    X to V 10 reps    W Back 10 reps      Neck Exercises: Supine   Neck Retraction 15 reps    Other Supine Exercise scapular retraction 15x    Other Supine Exercise supine punches x15, UE flexion 10X      Neck Exercises: Sidelying   Lateral Flexion Both;10 reps      Manual Therapy   Manual Therapy Soft tissue mobilization    Manual therapy comments Performed separate from all other activity    Soft tissue  mobilization supine to Rt SCM                    PT Short Term Goals - 04/08/20 1417      PT SHORT TERM GOAL #1   Title Patient will be independent with initial HEP and self-management strategies to improve functional outcomes    Time 2    Period Weeks    Status New    Target Date 04/22/20             PT Long Term Goals - 04/08/20 1417      PT LONG TERM GOAL #1   Title Patient will report at least 75% overall improvement in subjective complaint to indicate improvement in ability to perform ADLs.    Time 4    Period Weeks    Status New    Target Date 05/06/20      PT LONG TERM GOAL #2   Title Patient improve bilateral cervical rotation to at least 50 degrees in order to improve ability to scan environment for safety and while driving.    Time 4  Period Weeks    Status New    Target Date 05/06/20      PT LONG TERM GOAL #3   Title Patient will have restored RT shoulder AROM in order to reach overhead pain free to perform household chores and work tasks    Time 4    Period Weeks    Status New    Target Date 05/06/20      PT LONG TERM GOAL #4   Title Patient will report average neck pain no greater than 3/10 for improved quality of life and ability to perform ADLs and work tasks    Time 4    Period Weeks    Status New    Target Date 05/06/20                 Plan - 05/04/20 1730    Clinical Impression Statement Continued with established therex.  Less cues to complete cervical retraction correctly.  Pt with audible pop in neck during session with immediate reports of relief but then returned minutes later.  Added lateral flexion in sidelying without any pain voiced. Verbal and tactile cues to complete therex in correct form.  Completed manual to Rt SCM with noted tightness and several spasms that were painful with palpation.  Able to reduce/resolve these with pt reporting improvement at end of session.    Examination-Activity Limitations Caring for  Others;Carry;Reach Overhead;Transfers;Lift;Bathing;Dressing;Sleep;Hygiene/Grooming    Examination-Participation Restrictions Occupation;Community Activity;Driving;Yard Work;Cleaning    Stability/Clinical Decision Making Stable/Uncomplicated    Rehab Potential Good    PT Frequency 2x / week    PT Duration 4 weeks    PT Treatment/Interventions ADLs/Self Care Home Management;Aquatic Therapy;Biofeedback;Fluidtherapy;Parrafin;Patient/family education;Therapeutic activities;Functional mobility training;Manual techniques;Manual lymph drainage;Energy conservation;Splinting;Taping;Compression bandaging;Vasopneumatic Device;Orthotic Fit/Training;Therapeutic exercise;Contrast Bath;Cryotherapy;Electrical Stimulation;DME Instruction;Balance training;Neuromuscular re-education;Passive range of motion;Scar mobilization;Gait Network engineer;Iontophoresis 4mg /ml Dexamethasone;Moist Heat;Traction;Ultrasound;Dry needling;Visual/perceptual remediation/compensation;Spinal Manipulations;Joint Manipulations;Other (comment)    PT Next Visit Plan Add band strength when ready. continue manual as needed    PT Home Exercise Plan Eval: shoulder rolls, cervical rotation AROM, f/u on neck pillow 4/6 supine chin tuck, scap retraction, supine punches    Consulted and Agree with Plan of Care Patient           Patient will benefit from skilled therapeutic intervention in order to improve the following deficits and impairments:  Pain,Improper body mechanics,Impaired sensation,Increased fascial restricitons,Decreased activity tolerance,Decreased range of motion,Decreased strength,Hypomobility,Impaired UE functional use,Impaired perceived functional ability,Postural dysfunction,Impaired flexibility  Visit Diagnosis: Pain in right arm  Cervicalgia     Problem List There are no problems to display for this patient.  , PTA/CLT [phone]  [phone] 05/04/2020, 5:33 PM  [facility] [address] [place], [place], [place] Phone: [phone]   Fax:  [phone]  Name: David Eaton MRN: Sarina [id number] Date of Birth: 12/23/1980

## 2020-05-06 ENCOUNTER — Ambulatory Visit (HOSPITAL_COMMUNITY): Payer: Medicaid - Out of State | Admitting: Physical Therapy

## 2020-05-06 ENCOUNTER — Encounter (HOSPITAL_COMMUNITY): Payer: Self-pay | Admitting: Physical Therapy

## 2020-05-06 ENCOUNTER — Other Ambulatory Visit: Payer: Self-pay

## 2020-05-06 DIAGNOSIS — M542 Cervicalgia: Secondary | ICD-10-CM

## 2020-05-06 DIAGNOSIS — M79601 Pain in right arm: Secondary | ICD-10-CM

## 2020-05-06 NOTE — Therapy (Signed)
[facility] [address] [place], [place], [address] Phone: [phone]   Fax:  [phone]  Physical Therapy Treatment  Patient Details  Name: David Eaton MRN: [id number] Date of Birth: 03-Oct-1980 Referring Provider (PT): Eunice Blase MD  PHYSICAL THERAPY DISCHARGE SUMMARY  Visits from Start of Care: 6  Current functional level related to goals / functional outcomes: See below    Remaining deficits: See below     Education / Equipment: See assessment  Plan: Patient agrees to discharge.  Patient goals were partially met. Patient is being discharged due to lack of progress.  ?????      Encounter Date: 05/06/2020   PT End of Session - 05/06/20 0953    Visit Number 6    Number of Visits 8    Date for PT Re-Evaluation 05/06/20    Authorization Type Self Pay    PT Start Time 0948    PT Stop Time 1016    PT Time Calculation (min) 28 min    Activity Tolerance Patient tolerated treatment well    Behavior During Therapy [facility] for tasks assessed/performed;Restless           Past Medical History:  Diagnosis Date  . Diabetes mellitus without complication ([place])   . Hypertension     History reviewed. No pertinent surgical history.  There were no vitals filed for this visit.   Subjective Assessment - 05/06/20 0951    Subjective Patient says he feels the same. "Its like a hair going from my jaw down my neck". He feels he has a pinched nerve. Reports no improvement with therapy. "last night was the worst night I ever had".    Currently in Pain? Yes    Pain Score 9     Pain Location Neck    Pain Orientation Right    Pain Descriptors / Indicators --   "like a lining ache, thin lining ache, like a hair"   Pain Type Acute pain    Pain Onset More than a month ago    Pain Frequency Constant    Aggravating Factors  laying flat, turning head    Pain Relieving Factors meds    Effect of Pain on Daily Activities Limits               [facility] PT Assessment - 05/06/20 0001      Assessment   Medical Diagnosis RT neck and arm pain    Referring Provider (PT) Eunice Blase MD    Onset Date/Surgical Date 03/11/20      Prior Function   Level of Independence Independent    Vocation Full time employment    Audiological scientist      Cognition   Overall Cognitive Status Within Functional Limits for tasks assessed      Posture/Postural Control   Posture/Postural Control Postural limitations    Postural Limitations Rounded Shoulders;Forward head      AROM   Overall AROM Comments Full overhead elevation   previously unable to raise RT UE past shoulder height   Cervical Flexion 30   was 0   Cervical Extension 18   same   Cervical - Right Rotation 48   was 30   Cervical - Left Rotation 62   was 38     Strength   Right/Left Shoulder Right;Left    Right Shoulder Flexion 4+/5    Right Shoulder ABduction 4+/5    Right Shoulder External Rotation 4+/5    Left  Shoulder Flexion 4+/5    Left Shoulder ABduction 4+/5    Left Shoulder External Rotation 4+/5      Palpation   Palpation comment Min/ Mod TTP about inferior portion on RT SCM   Was mod/ max TTP                                  PT Short Term Goals - 05/06/20 0956      PT SHORT TERM GOAL #1   Title Patient will be independent with initial HEP and self-management strategies to improve functional outcomes    Baseline Reports compliance    Time 2    Period Weeks    Status Achieved    Target Date 04/22/20             PT Long Term Goals - 05/06/20 0956      PT LONG TERM GOAL #1   Title Patient will report at least 75% overall improvement in subjective complaint to indicate improvement in ability to perform ADLs.    Baseline Reports 0%    Time 4    Period Weeks    Status Not Met      PT LONG TERM GOAL #2   Title Patient improve bilateral cervical rotation to at least 50 degrees in order to improve ability  to scan environment for safety and while driving.    Baseline See AROM    Time 4    Period Weeks    Status Partially Met      PT LONG TERM GOAL #3   Title Patient will have restored RT shoulder AROM in order to reach overhead pain free to perform household chores and work tasks    Baseline MET    Time 4    Period Weeks    Status Achieved      PT LONG TERM GOAL #4   Title Patient will report average neck pain no greater than 3/10 for improved quality of life and ability to perform ADLs and work tasks    Baseline Reports 9/10    Time 4    Period Weeks    Status Not Met                 Plan - 05/06/20 1025    Clinical Impression Statement Performed reassessment today. Patient does show significant improvement in cervical and RT shoulder AOM, as well as UE strength, despite reporting 0% functional improvement with therapy. Patient continues to complain of intractable neck pain, though does show improved behavior pattern/ less repositioning throughout reassessment. Discussed with patient that presenting symptoms remain consistent with a muscle strain, notably the RT SCM. Patient remains concerned about muscle tension felt, and palpable trigger points and voices difficulty describing the sensation at times. He reports that manual treatments and self-stretching remain uneffective and at times feels things are worsening. At this point patient will be DC from therapy to return to referring provider for further assessment. Patient educated on HEP and encouraged to follow up with therapy services with any further questions or concerns.    Examination-Activity Limitations Caring for Others;Carry;Reach Overhead;Transfers;Lift;Bathing;Dressing;Sleep;Hygiene/Grooming    Examination-Participation Restrictions Occupation;Community Activity;Driving;Yard Work;Cleaning    Stability/Clinical Decision Making Stable/Uncomplicated    Rehab Potential Good    PT Treatment/Interventions ADLs/Self Care Home  Management;Aquatic Therapy;Biofeedback;Fluidtherapy;Parrafin;Patient/family education;Therapeutic activities;Functional mobility training;Manual techniques;Manual lymph drainage;Energy conservation;Splinting;Taping;Compression bandaging;Vasopneumatic Device;Orthotic Fit/Training;Therapeutic exercise;Contrast Bath;Cryotherapy;Electrical Stimulation;DME Instruction;Balance training;Neuromuscular re-education;Passive range of motion;Scar mobilization;Gait training;Stair  training;Iontophoresis 51m/ml Dexamethasone;Moist Heat;Traction;Ultrasound;Dry needling;Visual/perceptual remediation/compensation;Spinal Manipulations;Joint Manipulations;Other (comment)    PT Next Visit Plan DC to HEP    PT Home Exercise Plan Eval: shoulder rolls, cervical rotation AROM, f/u on neck pillow 4/6 supine chin tuck, scap retraction, supine punches    Consulted and Agree with Plan of Care Patient           Patient will benefit from skilled therapeutic intervention in order to improve the following deficits and impairments:  Pain,Improper body mechanics,Impaired sensation,Increased fascial restricitons,Decreased activity tolerance,Decreased range of motion,Decreased strength,Hypomobility,Impaired UE functional use,Impaired perceived functional ability,Postural dysfunction,Impaired flexibility  Visit Diagnosis: Pain in right arm  Cervicalgia     Problem List There are no problems to display for this patient. 10:28 AM, 05/06/20 CJosue HectorPT DPT  [facility] [phone] [address]Phone: [phone]  Fax:  [phone] Name: David PatilMRN: [id number] of Birth: 803/19/1982

## 2020-05-11 ENCOUNTER — Encounter (HOSPITAL_COMMUNITY): Payer: Medicaid - Out of State | Admitting: Physical Therapy

## 2020-05-13 ENCOUNTER — Encounter (HOSPITAL_COMMUNITY): Payer: Medicaid - Out of State

## 2020-05-13 ENCOUNTER — Ambulatory Visit: Payer: Medicaid - Out of State | Admitting: Family Medicine

## 2020-05-17 ENCOUNTER — Ambulatory Visit: Payer: Medicaid - Out of State | Admitting: Family Medicine

## 2020-05-18 ENCOUNTER — Ambulatory Visit: Payer: Medicaid - Out of State | Admitting: Family Medicine

## 2020-05-18 ENCOUNTER — Encounter (HOSPITAL_COMMUNITY): Payer: Medicaid - Out of State | Admitting: Physical Therapy

## 2020-05-20 ENCOUNTER — Encounter (HOSPITAL_COMMUNITY): Payer: Medicaid - Out of State

## 2020-05-25 ENCOUNTER — Encounter (HOSPITAL_COMMUNITY): Payer: Medicaid - Out of State | Admitting: Physical Therapy

## 2020-05-26 ENCOUNTER — Ambulatory Visit: Payer: Medicaid - Out of State | Admitting: Family Medicine

## 2020-05-27 ENCOUNTER — Encounter (HOSPITAL_COMMUNITY): Payer: Medicaid - Out of State | Admitting: Physical Therapy

## 2020-06-04 ENCOUNTER — Ambulatory Visit (INDEPENDENT_AMBULATORY_CARE_PROVIDER_SITE_OTHER): Payer: Medicaid - Out of State | Admitting: Primary Care

## 2020-06-10 ENCOUNTER — Other Ambulatory Visit: Payer: Self-pay

## 2020-06-10 ENCOUNTER — Ambulatory Visit (INDEPENDENT_AMBULATORY_CARE_PROVIDER_SITE_OTHER): Payer: Self-pay | Admitting: Family Medicine

## 2020-06-10 DIAGNOSIS — M79601 Pain in right arm: Secondary | ICD-10-CM

## 2020-06-10 DIAGNOSIS — M542 Cervicalgia: Secondary | ICD-10-CM

## 2020-06-10 MED ORDER — TRAMADOL HCL 50 MG PO TABS: 50.0000 mg | ORAL_TABLET | Freq: Four times a day (QID) | ORAL | 0 refills | Status: DC | PRN

## 2020-06-10 NOTE — Progress Notes (Signed)
   Office Visit Note   Patient: David Eaton           Date of Birth: Aug 05, 1980           MRN: [id number] Visit Date: 06/10/2020 Requested by: David Sessions, NP [address] [place],  [place] [address] PCP: David Sessions, NP  Subjective: Chief Complaint  Patient presents with  . Neck - Pain    Continues to have pain and swelling in the anterior neck. The PT did help, especially massaging that area. The neck stiffens up and just aches. Doing his home exercises, but these are not helping.    HPI: He is about 2 months status post injury resulting in neck and right arm pain.  Unfortunately he does not seem to be making any progress with physical therapy.  He complains of right-sided neck pain and swelling, with pain radiating into the right arm.  He states that his pain is generally around 9/10.  He is taking muscle relaxants on a regular basis as well as a lot of Aleve over-the-counter.              ROS:   All other systems were reviewed and are negative.  Objective: Vital Signs: There were no vitals taken for this visit.  Physical Exam:  General:  Alert and oriented, in no acute distress. Pulm:  Breathing unlabored. Psy:  Normal mood, congruent affect  Neck: He has soft tissue tenderness near the sternocleidomastoid muscle on the right.  He has tenderness in the right cervical paraspinous muscles.  Upper extremity strength and reflexes remain normal.  Imaging: No results found.  Assessment & Plan: 1.  Ongoing neck and right arm pain, concerning for cervical radiculopathy -MRI to further evaluate.  Tramadol as needed.  Out of work while awaiting results.     Procedures: No procedures performed        PMFS History: Patient Active Problem List   Diagnosis Date Noted  . Umbilical hernia without obstruction and without gangrene 11/12/2018  . Obstructive sleep apnea syndrome 02/02/2011  . Diabetes mellitus, type 2 (HCC) 10/31/2010  . Essential hypertension  10/31/2010  . Morbid obesity (HCC) 10/31/2010   Past Medical History:  Diagnosis Date  . Diabetes mellitus without complication (HCC)   . Hypertension     No family history on file.  No past surgical history on file. Social History   Occupational History  . Not on file  Tobacco Use  . Smoking status: Current Every Day Smoker    Types: Cigarettes  . Smokeless tobacco: Never Used  Vaping Use  . Vaping Use: Never used  Substance and Sexual Activity  . Alcohol use: Not Currently  . Drug use: Not Currently  . Sexual activity: Not on file

## 2020-06-23 ENCOUNTER — Ambulatory Visit: Payer: Medicaid - Out of State | Admitting: Nurse Practitioner

## 2020-06-30 ENCOUNTER — Ambulatory Visit (HOSPITAL_COMMUNITY): Payer: Self-pay

## 2020-06-30 ENCOUNTER — Ambulatory Visit: Payer: Medicaid - Out of State | Admitting: Nurse Practitioner

## 2020-07-14 ENCOUNTER — Ambulatory Visit (HOSPITAL_COMMUNITY)
Admission: RE | Admit: 2020-07-14 | Discharge: 2020-07-14 | Disposition: A | Discharge status: Home / Self Care | Payer: Medicaid - Out of State | Source: Ambulatory Visit | Attending: Family Medicine | Admitting: Family Medicine

## 2020-07-14 ENCOUNTER — Other Ambulatory Visit: Payer: Self-pay

## 2020-07-14 DIAGNOSIS — M79601 Pain in right arm: Secondary | ICD-10-CM | POA: Insufficient documentation

## 2020-07-14 DIAGNOSIS — M542 Cervicalgia: Secondary | ICD-10-CM | POA: Insufficient documentation

## 2020-07-14 IMAGING — MR MR CERVICAL SPINE W/O CM
7 series · 39 of 48 positions shown · non-contrast
Comparison: Cervical spine radiographs [DATE].

CLINICAL DATA: Neck pain. Right arm pain. Cervical radiculopathy,
no red flags. Additional history provided: Ongoing neck and right
arm pain concerning for cervical radiculopathy. History of neck
injury.

EXAM:
MRI CERVICAL SPINE WITHOUT CONTRAST
TECHNIQUE: Multiplanar, multisequence MR imaging of the cervical spine was
performed. No intravenous contrast was administered.

[Series 5: T2 · sagittal · 3.0mm · 0.69mm/px · 4 of 15 slices shown (1 of 2)]
[im 1/15]
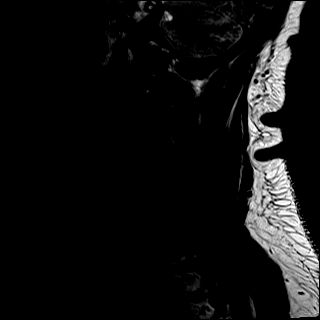
[im 5/15]
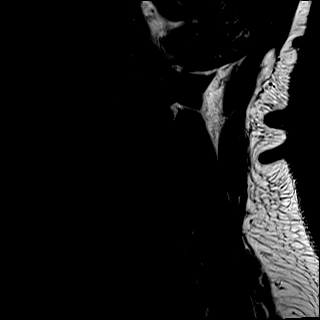
[im 10/15]
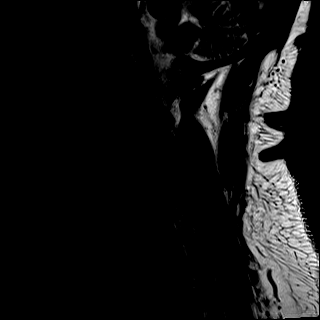
[im 15/15]
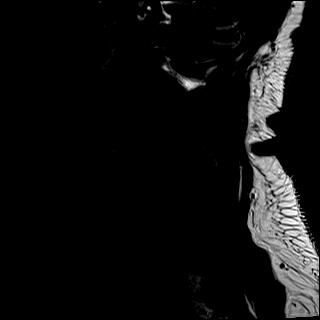

[Series 6: T1 · sagittal · 3.0mm · 0.86mm/px · 5 of 15 slices shown]
[im 1/15]
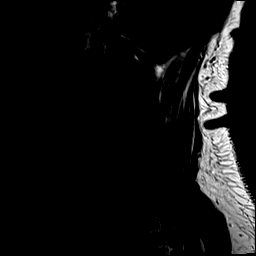
[im 4/15]
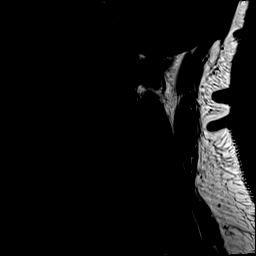
[im 8/15]
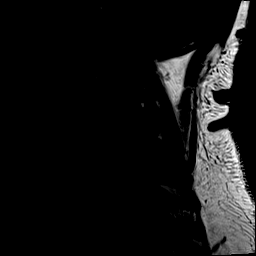
[im 11/15]
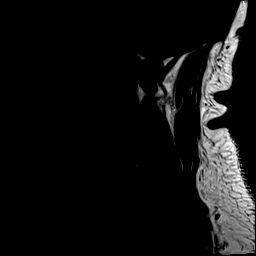
[im 15/15]
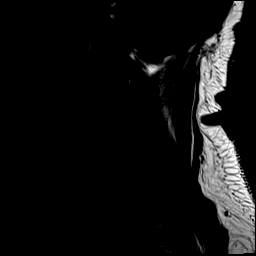

[Series 7: STIR · sagittal · 3.0mm · 0.69mm/px · 5 of 15 slices shown]
[im 1/15]
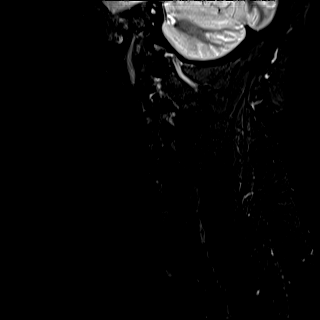
[im 4/15]
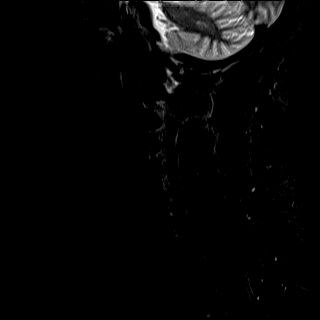
[im 8/15]
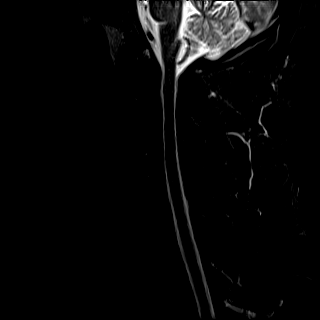
[im 11/15]
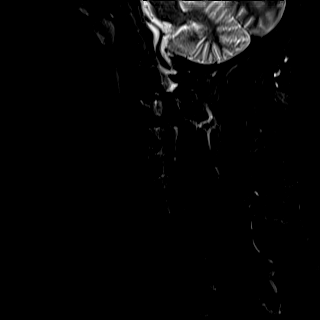
[im 15/15]
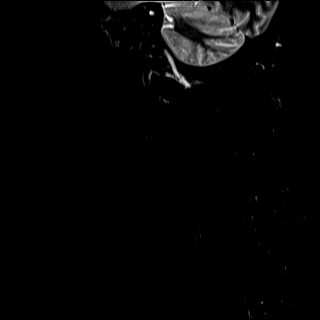

[Series 8: T2 · axial · 3.0mm · 0.70mm/px · z∈[-39,+83]mm · 9 of 38 slices shown (2 of 2)]
[im 1/38]
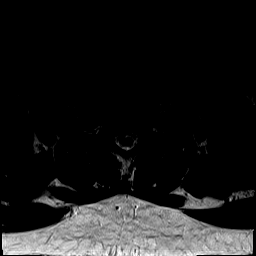
[im 7/38]
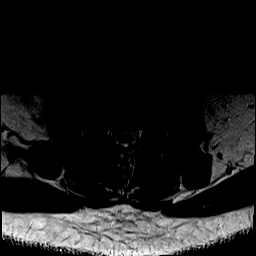
[im 11/38]
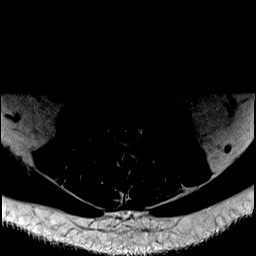
[im 17/38]
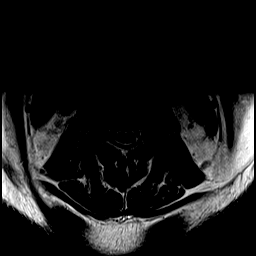
[im 21/38]
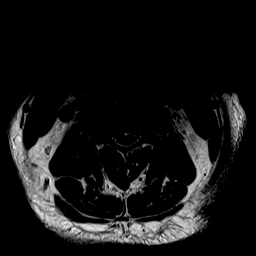
[im 27/38]
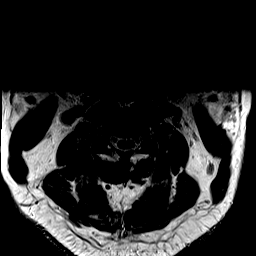
[im 31/38]
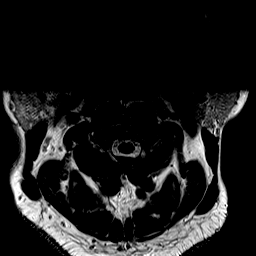
[im 34/38]
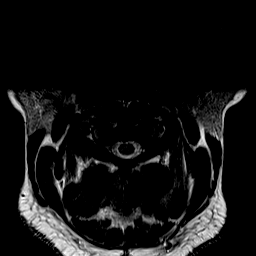
[im 38/38]
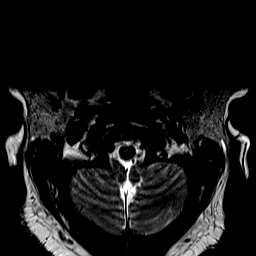

[Series 9: GRE · axial · 3.0mm · 0.78mm/px · z∈[-45,+76]mm · 8 of 38 slices shown]
[im 1/38]
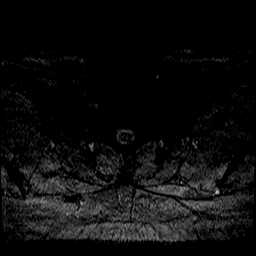
[im 7/38]
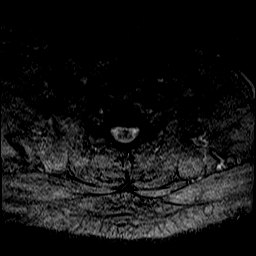
[im 11/38]
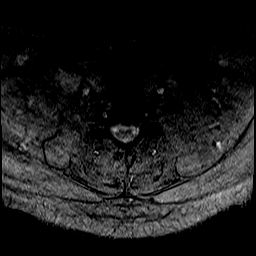
[im 17/38]
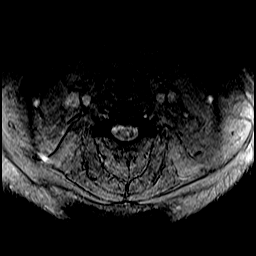
[im 21/38]
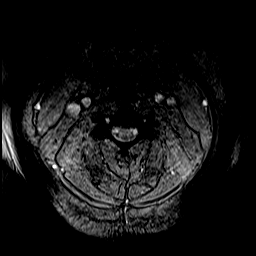
[im 27/38]
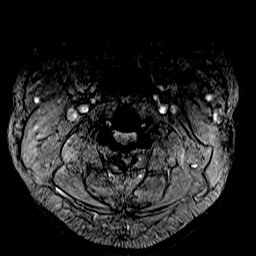
[im 31/38]
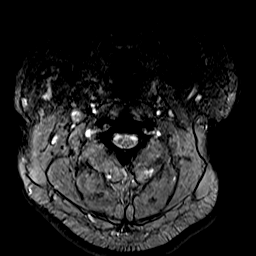
[im 38/38]
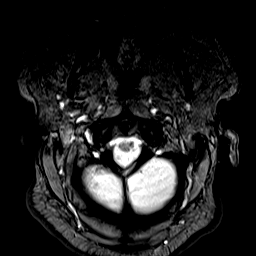

[Series 10: t2_tse_sag_fast repeat · sagittal · 3.0mm · 0.43mm/px · 5 of 15 slices shown]
[im 1/15]
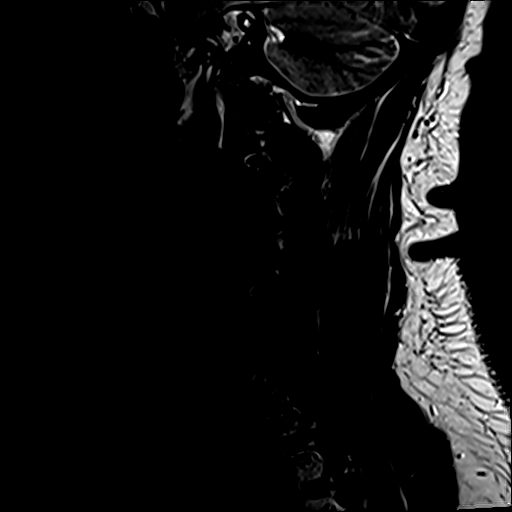
[im 4/15]
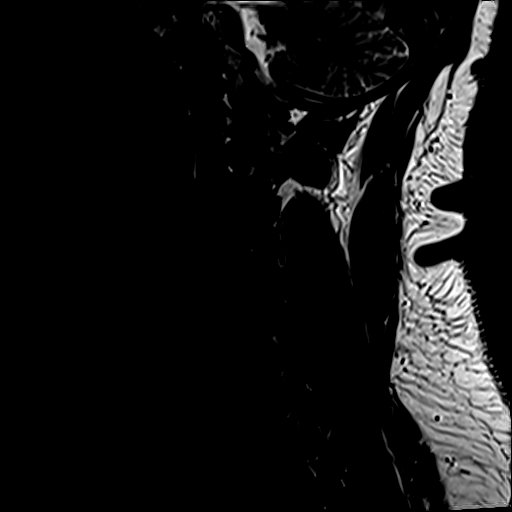
[im 8/15]
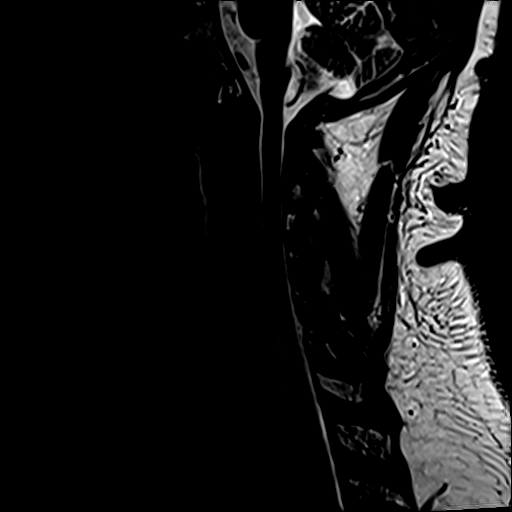
[im 11/15]
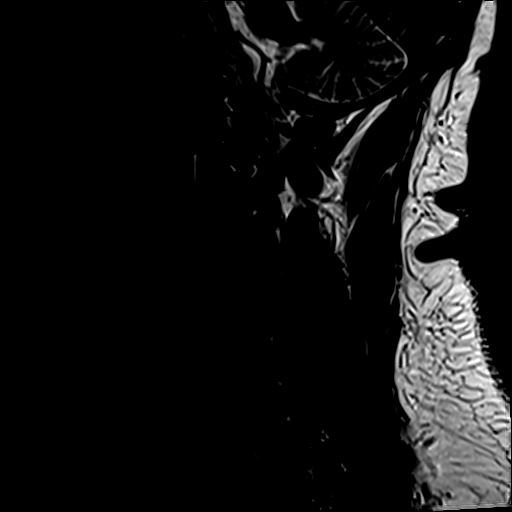
[im 15/15]
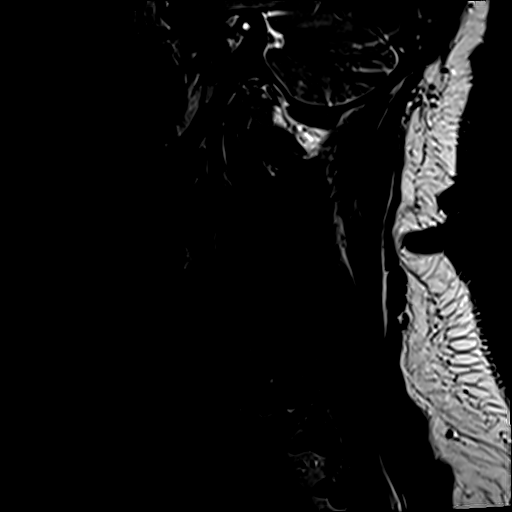

[Series 11: t1_tse_sag_fast repeat · sagittal · 3.0mm · 0.43mm/px · 3 of 15 slices shown]
[im 1/15]
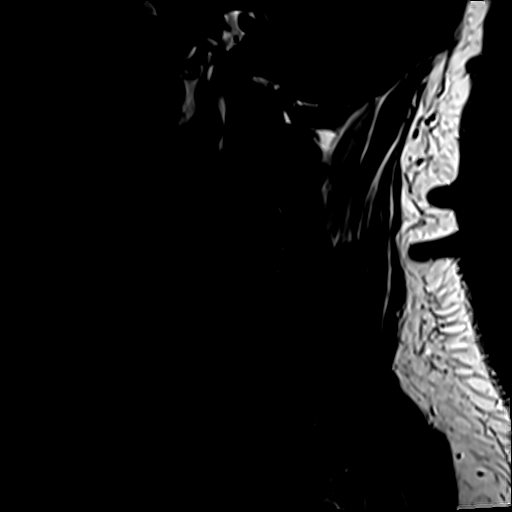
[im 4/15]
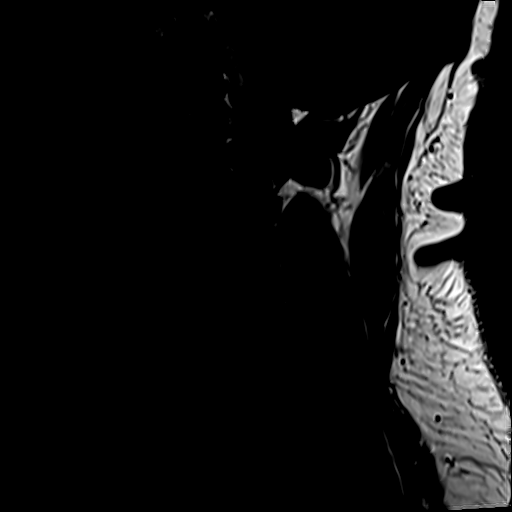
[im 8/15]
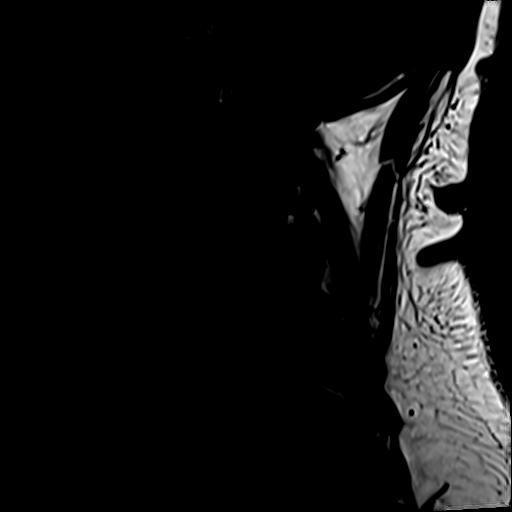

[39 of 48 positions shown; findings below may reference images not displayed]

FINDINGS: Mild-to-moderate intermittent motion degradation.

Alignment: Straightening of the expected cervical lordosis.

Vertebrae: Vertebral body height is maintained. No significant
marrow edema or focal suspicious osseous lesion.

Cord: No spinal cord signal abnormality is identified.

Posterior Fossa, vertebral arteries, paraspinal tissues: No
abnormality identified within included portions of the posterior
fossa. Flow voids preserved within the imaged cervical vertebral
arteries. Paraspinal soft tissues within normal limits.

Disc levels:

No more than mild disc degeneration at any level.

Borderline congenitally narrow cervical spinal canal.

C2-C3: No significant disc herniation or stenosis.

C3-C4: Minimal uncovertebral hypertrophy on the left. No significant
disc herniation or stenosis.

C4-C5: Minimal uncovertebral hypertrophy on the left. Mild facet
hypertrophy (greater on the left). No significant disc herniation or
spinal canal stenosis. Mild relative left neural foraminal
narrowing.

C5-C6: Minimal facet arthrosis. No significant disc herniation or
stenosis.

C6-C7: No significant disc herniation or spinal canal stenosis. Mild
uncovertebral hypertrophy on the right with mild relative right
neural foraminal narrowing.

C7-T1: No significant disc herniation or stenosis.
IMPRESSION: Intermittently motion degraded exam.

Mild cervical spondylosis, as outlined. No significant spinal canal
stenosis. Mild neural foraminal narrowing on the left at C4-C5, and
on the right at C6-C7.

Straightening of the expected cervical lordosis.

## 2020-07-16 ENCOUNTER — Telehealth: Payer: Self-pay | Admitting: Family Medicine

## 2020-07-16 NOTE — Telephone Encounter (Signed)
Neck MRI shows arthritis (mild) at several levels, but no ruptured discs or pinched nerves.  No indication for surgery.    If still in a lot of pain, could consider referral for cortisone injections.

## 2020-07-27 ENCOUNTER — Ambulatory Visit (INDEPENDENT_AMBULATORY_CARE_PROVIDER_SITE_OTHER): Payer: Medicaid Other | Admitting: Family Medicine

## 2020-07-27 ENCOUNTER — Other Ambulatory Visit: Payer: Self-pay

## 2020-07-27 ENCOUNTER — Encounter: Payer: Self-pay | Admitting: Family Medicine

## 2020-07-27 DIAGNOSIS — M542 Cervicalgia: Secondary | ICD-10-CM

## 2020-07-27 DIAGNOSIS — M79601 Pain in right arm: Secondary | ICD-10-CM

## 2020-07-27 NOTE — Progress Notes (Signed)
   Office Visit Note   Patient: David Eaton           Date of Birth: [birth year]           MRN: [id number] Visit Date: 07/27/2020 Requested by: Grayce Sessions, NP [address] [place],  [place] [address] PCP: Grayce Sessions, NP  Subjective: Chief Complaint  Patient presents with   Neck - Pain, Follow-up    MRI review    HPI: He is almost 4 months status post neck injury, here for follow-up.  He had MRI of the cervical spine recently which showed mild spondylosis but no nerve impingement, no indication for surgery.  He continues to complain of pain more in the anterior lateral right side of the neck.  He has sensation of puffiness in that area, and when he swings his head to the left in a certain manner, sometimes it causes his neck to "adjust" and temporarily feel better.               ROS:   All other systems were reviewed and are negative.  Objective: Vital Signs: There were no vitals taken for this visit.  Physical Exam:  General:  Alert and oriented, in no acute distress. Pulm:  Breathing unlabored. Psy:  Normal mood, congruent affect.  Neck: There is some soft tissue prominence and tenderness near the right sternocleidomastoid.  There is slight pain with leftward rotation of his neck.   Imaging: No results found.  Assessment & Plan: Ongoing right sided anterolateral neck pain for months status post injury -I reviewed his MRI images with him.  They really do not adequately show the soft tissues in the anterolateral neck.  We will order MRI soft tissue to further evaluate this area.     Procedures: No procedures performed        PMFS History: Patient Active Problem List   Diagnosis Date Noted   Umbilical hernia without obstruction and without gangrene 11/12/2018   Obstructive sleep apnea syndrome 02/02/2011   Diabetes mellitus, type 2 (HCC) 10/31/2010   Essential hypertension 10/31/2010   Morbid obesity (HCC) 10/31/2010   Past Medical History:   Diagnosis Date   Diabetes mellitus without complication (HCC)    Hypertension     History reviewed. No pertinent family history.  History reviewed. No pertinent surgical history. Social History   Occupational History   Not on file  Tobacco Use   Smoking status: Every Day    Pack years: 0.00    Types: Cigarettes   Smokeless tobacco: Never  Vaping Use   Vaping Use: Never used  Substance and Sexual Activity   Alcohol use: Not Currently   Drug use: Not Currently   Sexual activity: Not on file

## 2020-07-28 ENCOUNTER — Ambulatory Visit: Payer: Medicaid Other | Admitting: Family Medicine

## 2020-08-12 ENCOUNTER — Ambulatory Visit (HOSPITAL_COMMUNITY): Admission: RE | Admit: 2020-08-12 | Payer: Self-pay | Source: Ambulatory Visit

## 2020-08-24 ENCOUNTER — Ambulatory Visit (HOSPITAL_COMMUNITY)
Admission: RE | Admit: 2020-08-24 | Discharge: 2020-08-24 | Disposition: A | Discharge status: Home / Self Care | Payer: Self-pay | Source: Ambulatory Visit | Attending: Family Medicine | Admitting: Family Medicine

## 2020-08-24 ENCOUNTER — Other Ambulatory Visit: Payer: Self-pay

## 2020-08-24 ENCOUNTER — Telehealth (INDEPENDENT_AMBULATORY_CARE_PROVIDER_SITE_OTHER): Payer: Self-pay | Admitting: Primary Care

## 2020-08-24 DIAGNOSIS — M542 Cervicalgia: Secondary | ICD-10-CM | POA: Insufficient documentation

## 2020-08-24 IMAGING — MR MR NECK SOFT TISSUE ONLY W/O CM
6 series · 48 of 48 positions shown · non-contrast
Comparison: Cervical MRI [DATE]

CLINICAL DATA: Right sided neck pain and swelling over the last
year. Neck mass, nonpulsatile.

EXAM:
MRI OF THE NECK WITHOUT CONTRAST
TECHNIQUE: Multiplanar, multisequence MR imaging of the neck was performed. No
intravenous contrast was administered.

[Series 5: T1 · sagittal · 4.0mm · 0.88mm/px · 7 of 36 slices shown (1 of 3)]
[im 1/36]
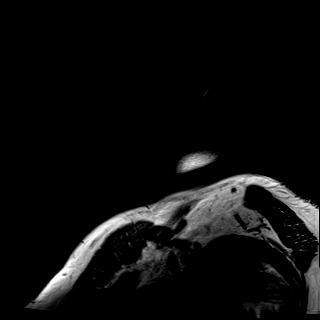
[im 6/36]
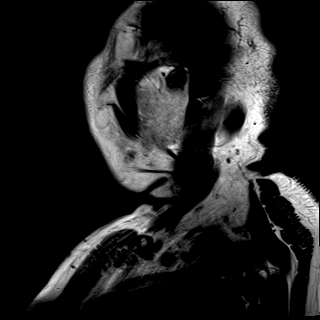
[im 12/36]
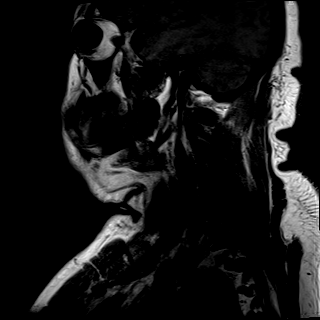
[im 18/36]
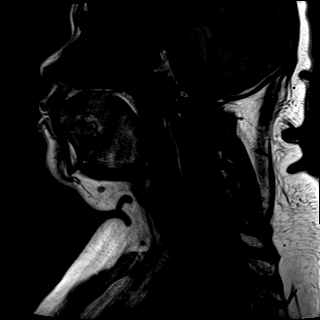
[im 24/36]
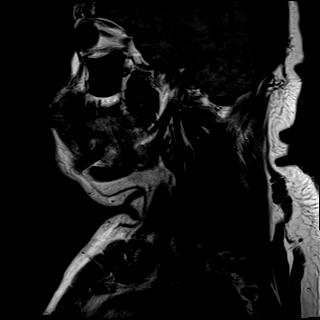
[im 30/36]
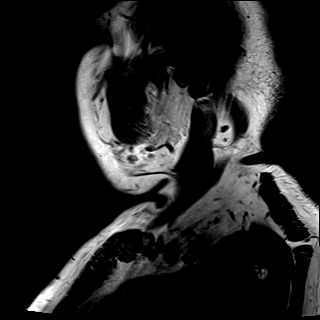
[im 36/36]
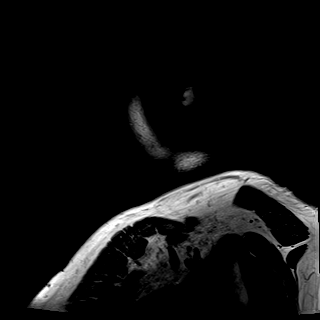

[Series 6: T1 · axial · 4.0mm · 0.94mm/px · z∈[-97,+143]mm · 10 of 50 slices shown (2 of 3)]
[im 1/50]
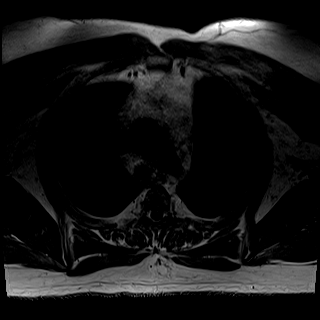
[im 6/50]
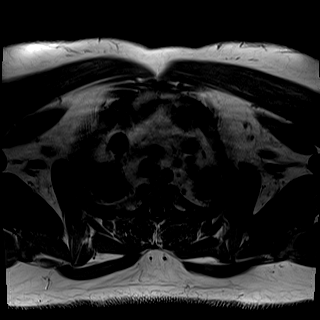
[im 11/50]
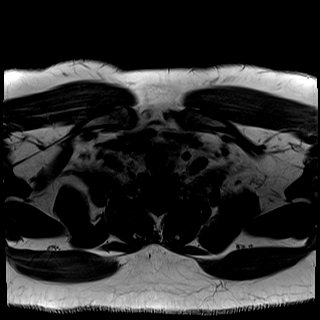
[im 17/50]
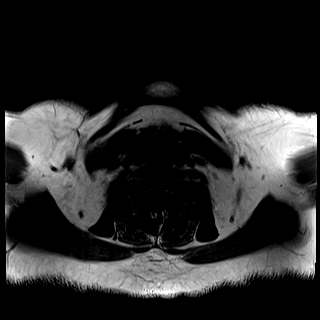
[im 22/50]
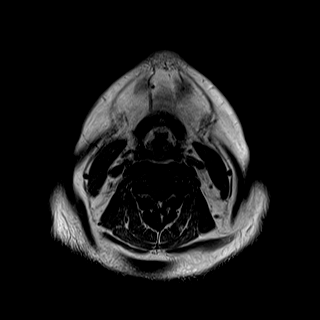
[im 28/50]
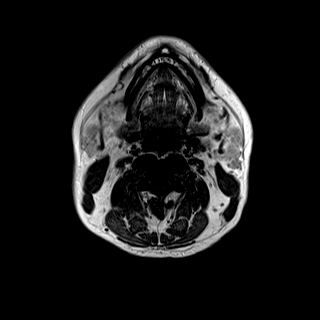
[im 33/50]
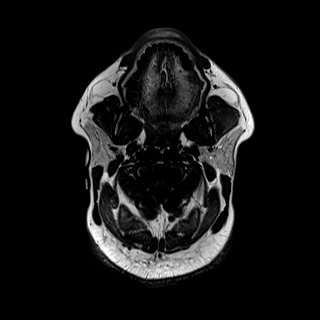
[im 39/50]
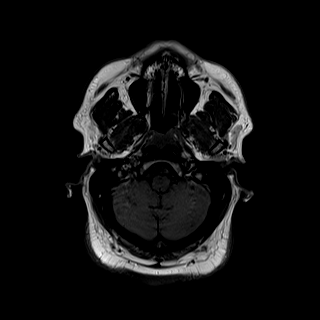
[im 44/50]
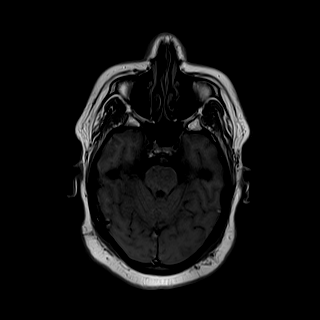
[im 50/50]
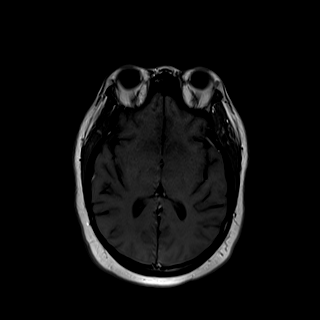

[Series 7: T1 · coronal · 4.0mm · 0.88mm/px · 7 of 36 slices shown (3 of 3)]
[im 1/36]
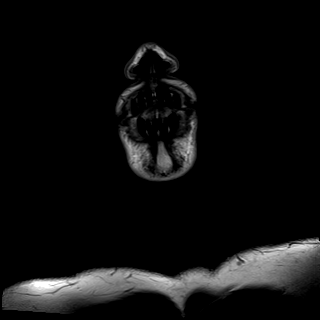
[im 6/36]
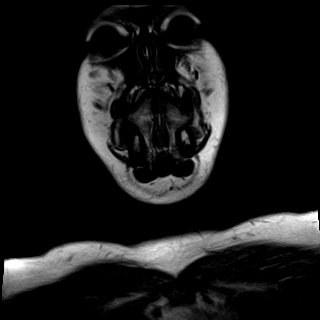
[im 12/36]
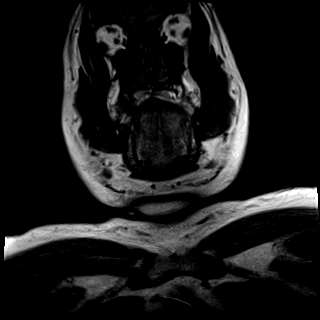
[im 18/36]
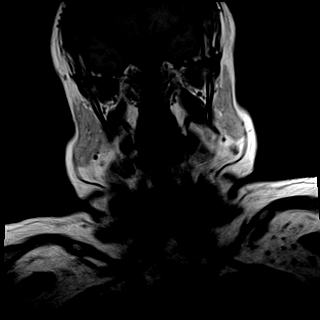
[im 24/36]
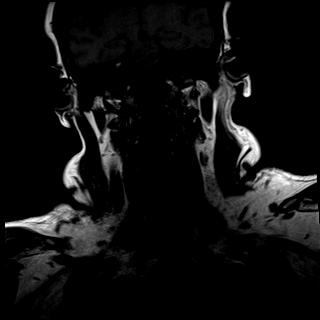
[im 30/36]
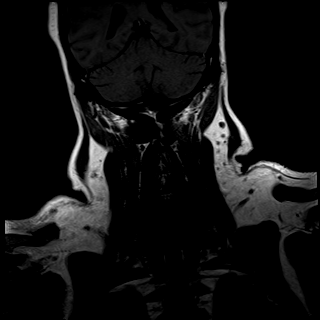
[im 36/36]
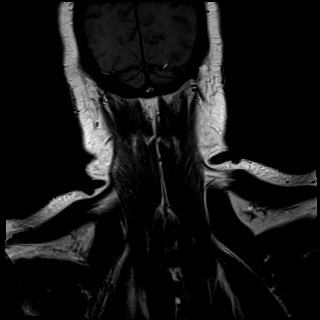

[Series 9: T2 fat-sat · sagittal · 4.0mm · 1.09mm/px · 7 of 36 slices shown (1 of 2)]
[im 1/36]
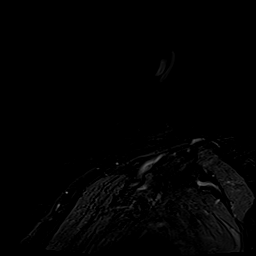
[im 6/36]
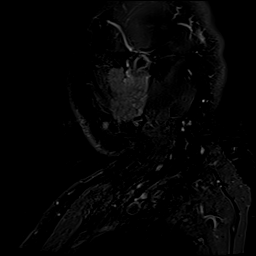
[im 12/36]
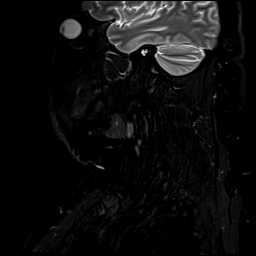
[im 18/36]
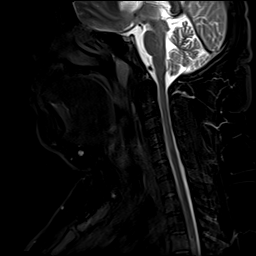
[im 24/36]
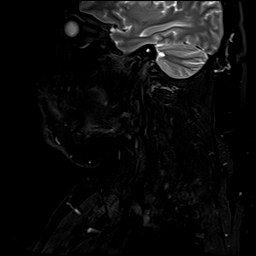
[im 30/36]
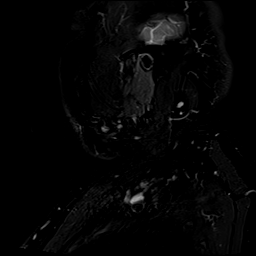
[im 36/36]
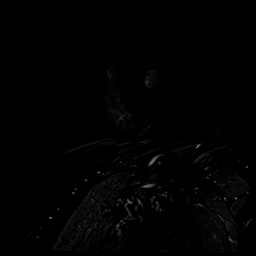

[Series 11: T2 fat-sat · axial · 4.0mm · 1.17mm/px · z∈[-97,+143]mm · 10 of 50 slices shown (2 of 2)]
[im 1/50]
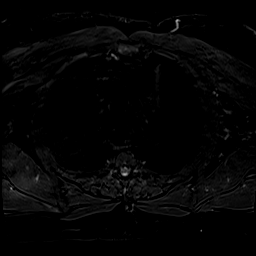
[im 6/50]
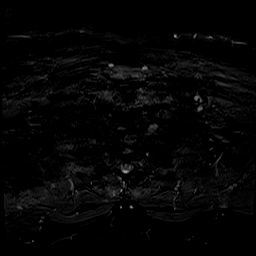
[im 11/50]
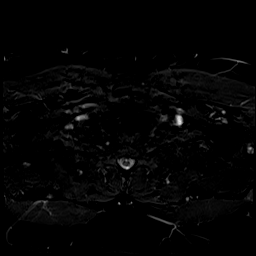
[im 17/50]
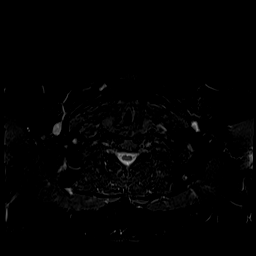
[im 22/50]
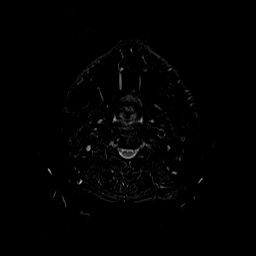
[im 28/50]
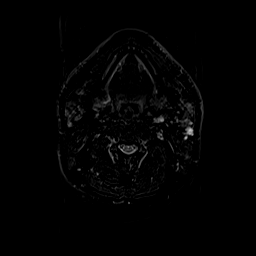
[im 33/50]
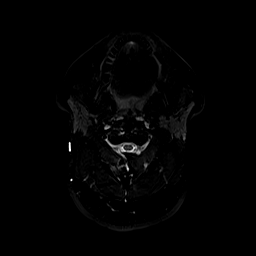
[im 39/50]
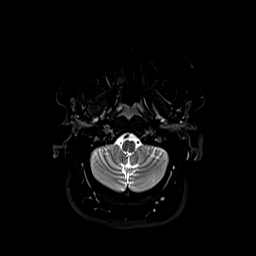
[im 44/50]
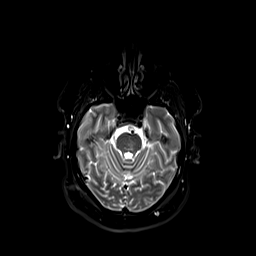
[im 50/50]
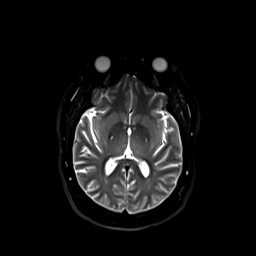

[Series 12: t2_qtse_stir_cor · coronal · 4.0mm · 0.88mm/px · 7 of 36 slices shown]
[im 1/36]
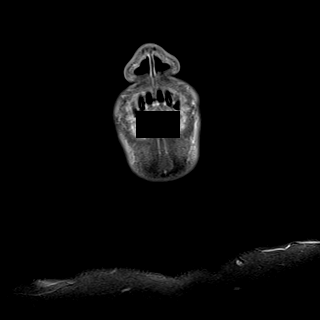
[im 6/36]
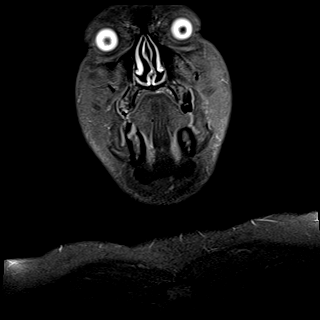
[im 12/36]
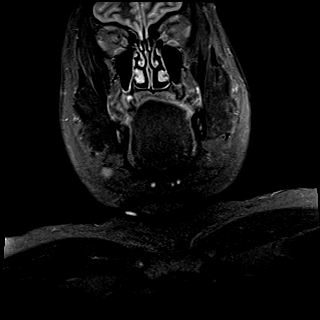
[im 18/36]
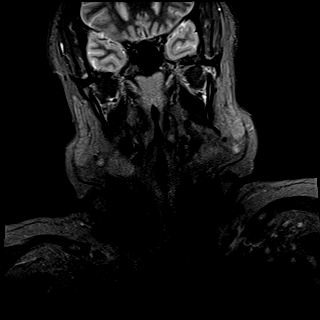
[im 24/36]
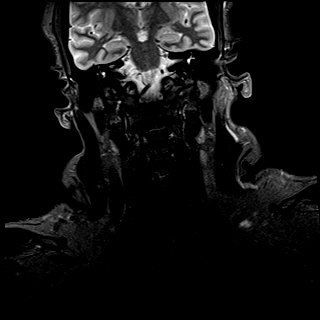
[im 30/36]
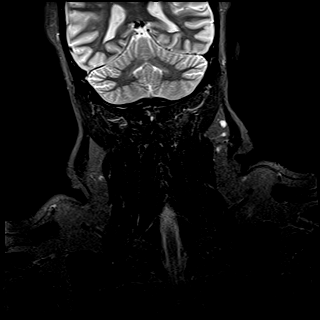
[im 36/36]
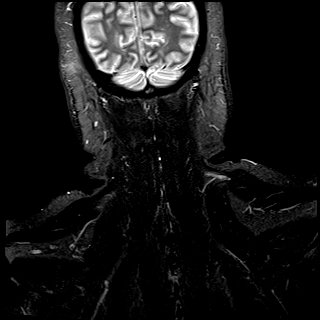

[48 of 48 positions shown; findings below may reference images not displayed]

FINDINGS: Pharynx and larynx: No mucosal or submucosal lesion.

Salivary glands: Parotid and submandibular glands are normal. Skin
marker in the area of concern is placed just posterior to the right
parotid gland, overlying a normal appearing upper
sternocleidomastoid muscle.

Thyroid: Normal

Lymph nodes: No lymphadenopathy.

Vascular: No abnormal vascular finding.

Limited intracranial: Normal

Visualized orbits: Normal

Mastoids and visualized paranasal sinuses: Clear

Skeleton: Normal

Upper chest: No abnormality seen.

Other: None significant.
IMPRESSION: No abnormality identified. No neck mass seen on the right. Skin
marker overlies the soft tissues posterior to the right parotid
gland. No sign of mass or adenopathy in that region.

## 2020-08-24 NOTE — Telephone Encounter (Signed)
Sent to covering provider to fill if appropriate.

## 2020-08-24 NOTE — Telephone Encounter (Signed)
Pt called to get a refill for Insulin Glargine [facility]) 100 UNIT/ML/ he states that the office mails to his home address and doesn't go through a pharmacy/ please advise asap

## 2020-08-24 NOTE — Telephone Encounter (Signed)
Which medication ?

## 2020-08-25 ENCOUNTER — Other Ambulatory Visit (INDEPENDENT_AMBULATORY_CARE_PROVIDER_SITE_OTHER): Payer: Self-pay | Admitting: Nurse Practitioner

## 2020-08-25 ENCOUNTER — Telehealth: Payer: Self-pay | Admitting: Family Medicine

## 2020-08-25 DIAGNOSIS — E119 Type 2 diabetes mellitus without complications: Secondary | ICD-10-CM

## 2020-08-25 MED ORDER — BASAGLAR KWIKPEN 100 UNIT/ML ~~LOC~~ SOPN: 20.0000 [IU] | PEN_INJECTOR | Freq: Every day | SUBCUTANEOUS | 0 refills | Status: DC

## 2020-08-25 NOTE — Telephone Encounter (Signed)
basaglar

## 2020-08-25 NOTE — Telephone Encounter (Signed)
Neck MRI scan looks normal.  No masses/cancer/tumors.  No swollen lymph nodes.  Nothing to explain the symptoms.

## 2020-08-25 NOTE — Telephone Encounter (Signed)
Reordered with 0 refills - looks like he is due for a follow up appointment

## 2020-08-26 NOTE — Telephone Encounter (Signed)
I called and advised the patient of the results.  

## 2020-09-06 ENCOUNTER — Telehealth (INDEPENDENT_AMBULATORY_CARE_PROVIDER_SITE_OTHER): Payer: Self-pay | Admitting: Primary Care

## 2020-09-06 NOTE — Telephone Encounter (Signed)
Contacted pharmacy to inform that refill was sent by covering provider. PCP has since returned to office and is unable to refill as requested as patient needs an appointment for diabetes management. Pharmacy staff stated she would contact patient and inform.

## 2020-09-06 NOTE — Telephone Encounter (Signed)
Pharmacist would like to know if a quantity of 19ml can be prescribed for Insulin Glargine [facility]) 100 UNIT/ML please advise  [id number] - [place], [place] - [address] [place] [address] Phone:  [phone]  Fax:  [phone]

## 2020-11-02 ENCOUNTER — Ambulatory Visit (HOSPITAL_BASED_OUTPATIENT_CLINIC_OR_DEPARTMENT_OTHER): Payer: Medicaid Other | Admitting: Orthopaedic Surgery

## 2021-07-11 ENCOUNTER — Encounter (HOSPITAL_COMMUNITY): Payer: Self-pay | Admitting: Emergency Medicine

## 2021-07-11 ENCOUNTER — Other Ambulatory Visit: Payer: Self-pay

## 2021-07-11 ENCOUNTER — Emergency Department (HOSPITAL_COMMUNITY)
Admission: EM | Admit: 2021-07-11 | Discharge: 2021-07-11 | Disposition: A | Discharge status: Home / Self Care | Payer: Medicaid - Out of State | Attending: Emergency Medicine | Admitting: Emergency Medicine

## 2021-07-11 DIAGNOSIS — M436 Torticollis: Secondary | ICD-10-CM | POA: Insufficient documentation

## 2021-07-11 DIAGNOSIS — E119 Type 2 diabetes mellitus without complications: Secondary | ICD-10-CM | POA: Insufficient documentation

## 2021-07-11 DIAGNOSIS — Z79899 Other long term (current) drug therapy: Secondary | ICD-10-CM | POA: Insufficient documentation

## 2021-07-11 DIAGNOSIS — Z760 Encounter for issue of repeat prescription: Secondary | ICD-10-CM | POA: Insufficient documentation

## 2021-07-11 DIAGNOSIS — Z794 Long term (current) use of insulin: Secondary | ICD-10-CM | POA: Insufficient documentation

## 2021-07-11 DIAGNOSIS — I10 Essential (primary) hypertension: Secondary | ICD-10-CM | POA: Diagnosis not present

## 2021-07-11 DIAGNOSIS — M542 Cervicalgia: Secondary | ICD-10-CM | POA: Diagnosis present

## 2021-07-11 DIAGNOSIS — Z7984 Long term (current) use of oral hypoglycemic drugs: Secondary | ICD-10-CM | POA: Insufficient documentation

## 2021-07-11 MED ORDER — METHOCARBAMOL 500 MG PO TABS: 500.0000 mg | ORAL_TABLET | Freq: Two times a day (BID) | ORAL | 0 refills | Status: AC

## 2021-07-11 MED ORDER — HYDROCHLOROTHIAZIDE 25 MG PO TABS: 25.0000 mg | ORAL_TABLET | Freq: Every day | ORAL | 3 refills | Status: DC

## 2021-07-11 MED ORDER — AMLODIPINE BESYLATE 10 MG PO TABS: 10.0000 mg | ORAL_TABLET | Freq: Every day | ORAL | 3 refills | Status: DC

## 2021-07-11 MED ORDER — METFORMIN HCL 1000 MG PO TABS: 1000.0000 mg | ORAL_TABLET | Freq: Two times a day (BID) | ORAL | 3 refills | Status: DC
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
## 2021-07-11 NOTE — ED Triage Notes (Signed)
Pt presents with neck stiffness for 1 week, seen for same approx 7 months ago.

## 2021-07-28 ENCOUNTER — Ambulatory Visit: Payer: Medicaid Other | Admitting: Surgery

## 2021-08-25 ENCOUNTER — Ambulatory Visit (INDEPENDENT_AMBULATORY_CARE_PROVIDER_SITE_OTHER): Payer: Self-pay | Admitting: Surgery

## 2021-08-25 ENCOUNTER — Ambulatory Visit: Payer: Self-pay

## 2021-08-25 ENCOUNTER — Encounter: Payer: Self-pay | Admitting: Surgery

## 2021-08-25 VITALS — BP 155/104 | HR 81 | Ht 65.0 in | Wt 273.4 lb

## 2021-08-25 DIAGNOSIS — M7541 Impingement syndrome of right shoulder: Secondary | ICD-10-CM

## 2021-08-25 DIAGNOSIS — M542 Cervicalgia: Secondary | ICD-10-CM
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
## 2021-09-06 ENCOUNTER — Other Ambulatory Visit: Payer: Medicaid Other

## 2021-09-06 ENCOUNTER — Inpatient Hospital Stay: Admission: RE | Admit: 2021-09-06 | Payer: Medicaid Other | Source: Ambulatory Visit

## 2021-09-06 ENCOUNTER — Telehealth: Payer: Self-pay | Admitting: Surgery

## 2021-09-06 NOTE — Telephone Encounter (Signed)
Emailed AP with [facility] scheduling-they will contact pt to schdule appt

## 2021-09-06 NOTE — Telephone Encounter (Signed)
Patient came in today stating [facility] cancelled his appt today for a CT being they do not accept his Out of state insurance he stated he has gotten one done at [facility] and they accepted it can we put the order in there instead please advise

## 2021-09-15 ENCOUNTER — Ambulatory Visit: Payer: Medicaid Other | Admitting: Surgery

## 2021-09-29 ENCOUNTER — Telehealth: Payer: Self-pay | Admitting: Surgery

## 2021-09-29 NOTE — Telephone Encounter (Signed)
Sending to you as Fayrene Fearing saw this patient and has for patient to follow up with him. Thanks.

## 2021-09-29 NOTE — Telephone Encounter (Signed)
Please advise on message below. Thank you!

## 2021-09-29 NOTE — Telephone Encounter (Signed)
Pt called and states that he would like to know if we would have the mri results tomorrow evening so they could just take one day off rather than 2 days since they [place] way in [place]  CB [phone]

## 2021-09-30 ENCOUNTER — Ambulatory Visit (HOSPITAL_COMMUNITY): Payer: Medicaid Other

## 2021-09-30 ENCOUNTER — Other Ambulatory Visit (HOSPITAL_COMMUNITY): Payer: Medicaid Other

## 2021-10-05 ENCOUNTER — Ambulatory Visit (HOSPITAL_COMMUNITY)
Admission: RE | Admit: 2021-10-05 | Discharge: 2021-10-05 | Disposition: A | Discharge status: Home / Self Care | Payer: Self-pay | Source: Ambulatory Visit | Attending: Surgery | Admitting: Surgery

## 2021-10-05 ENCOUNTER — Encounter (HOSPITAL_COMMUNITY): Payer: Self-pay

## 2021-10-05 DIAGNOSIS — M7541 Impingement syndrome of right shoulder: Secondary | ICD-10-CM | POA: Insufficient documentation

## 2021-10-05 MED ORDER — POVIDONE-IODINE 10 % EX SOLN
CUTANEOUS | Status: AC
  Administered 2021-10-05: 1
  Filled 2021-10-05: qty 14.8

## 2021-10-05 MED ORDER — LIDOCAINE HCL (PF) 1 % IJ SOLN
INTRAMUSCULAR | Status: AC
  Administered 2021-10-05: 5 mL
  Filled 2021-10-05: qty 5

## 2021-10-05 MED ORDER — SODIUM CHLORIDE (PF) 0.9 % IJ SOLN
INTRAMUSCULAR | Status: AC
  Administered 2021-10-05: 10 mL
  Filled 2021-10-05: qty 10

## 2021-10-05 MED ORDER — GADOBUTROL 1 MMOL/ML IV SOLN
0.5000 mL | Freq: Once | INTRAVENOUS | Status: AC | PRN
  Administered 2021-10-05: 0.05 mL

## 2021-10-05 MED ORDER — IOHEXOL 180 MG/ML  SOLN
INTRAMUSCULAR | Status: AC
  Filled 2021-10-05: qty 10

## 2021-10-05 MED ORDER — IOHEXOL 180 MG/ML  SOLN
INTRAMUSCULAR | Status: AC
  Administered 2021-10-05: 15 mL
  Filled 2021-10-05: qty 10

## 2021-10-06 ENCOUNTER — Encounter: Payer: Self-pay | Admitting: Surgery

## 2021-10-06 ENCOUNTER — Ambulatory Visit (INDEPENDENT_AMBULATORY_CARE_PROVIDER_SITE_OTHER): Payer: Self-pay | Admitting: Surgery

## 2021-10-06 VITALS — BP 158/95 | HR 100 | Ht 65.0 in | Wt 273.4 lb

## 2021-10-06 DIAGNOSIS — M7541 Impingement syndrome of right shoulder: Secondary | ICD-10-CM

## 2021-10-07 ENCOUNTER — Telehealth: Payer: Self-pay

## 2021-10-07 NOTE — Telephone Encounter (Signed)
Patient referred by Jeneen Rinks to Dr Marlou Sa for right shoulder. I have scheduled him for 345 Wednesday afternoon. LMVM for patient advising

## 2021-10-07 NOTE — Progress Notes (Signed)
41 year old black male with history of chronic right shoulder pain comes in for review of his right shoulder MRI that was done October 05, 2021.  Report showed:  EXAM: MRI OF THE RIGHT SHOULDER WITH CONTRAST   TECHNIQUE: Multiplanar, multisequence MR imaging of the right shoulder was performed following the administration of intra-articular contrast.   CONTRAST:  See Injection Documentation.   COMPARISON:  Right shoulder radiographs 02/13/2020   FINDINGS: Rotator cuff: There are high-grade partial-thickness fluid bright midsubstance tears within the mid and posterior supraspinatus tendon footprint minimally extending into the anterior infraspinatus, measuring up to 13 mm in AP dimension (sagittal series 10, image 20) and 7 mm in transverse dimension (coronal series 1001 images 12 through 14). There is mild intermediate T2 signal extending along the longitudinal dimension of the more proximal supraspinatus tendon in this region. No tendon retraction. Mild-to-moderate anterior infraspinatus intermediate T2 signal and thickening tendinosis. The subscapularis and teres minor are intact.   Muscles:  Mild supraspinatus muscle atrophy.   Biceps long head: The intra-articular long head of the biceps tendon is intact.   Acromioclavicular Joint: There is mild widening of the acromioclavicular joint, with the clavicular head mildly elevated with respect to the acromion. The [facility] joint measures up to approximately 5 mm. Mild AC joint effusion. Moderate to high-grade marrow edema on both sides of the [facility] joint. Moderate distal anterolateral subacromial spurring. Type 2 acromion. The distal lateral subacromial space measures 8 mm (sagittal series 10, image 16).   Trace fluid within the subacromial/subdeltoid bursa.   Glenohumeral Joint: Mild thinning of the glenoid and humeral head cartilage. There are moderate degenerative cystic changes within the anterior inferior subchondral labrum.  These also appear to partially extend anteriorly, outside of the bone and may partially represent paralabral cysts (axial series 5 images 18 and 19, sagittal series 10 images 8 through 10, coronal series 1001 images 16 and 17).   Labrum: There is thin curvilinear extension of contrast into the anterior 3-4 o'clock chondrolabral junction (axial images 16 through 18).   Bones:  No acute fracture.   Other: There is edema along the longitudinal course of the majority of the slightly anterior aspect of the deltoid muscle (sagittal series 10 images 16 through 25). This suggests a muscle strain.   IMPRESSION: 1. High-grade partial-thickness midsubstance tears of the mid and posterior supraspinatus minimally extending into the anterior infraspinatus, measuring up to 13 mm in AP dimension. No tendon retraction. 2. Mild supraspinatus muscle atrophy. 3. Mild elevation of the clavicular head and minimal widening of the acromioclavicular joint with moderate degenerative changes of the acromioclavicular joint. There is high-grade subchondral marrow edema suggesting mobility and a source of pain at the acromioclavicular joint. 4. Small anterior, slightly inferior glenoid chondrolabral junction tear with associated paralabral cysts. 5. Edema within the anterior deltoid musculature suggesting muscle strain.     Electronically Signed   By: Yvonne Kendall M.D.   On: 10/05/2021 16:10   Continues have ongoing pain throughout his shoulder.   Exam Pleasant male alert and oriented in no acute distress.  Plan I reviewed the study with patient today.  I will have him follow-up with Dr. Marlou Sa here in the office to discuss further treatment options.

## 2021-10-08 DIAGNOSIS — M7541 Impingement syndrome of right shoulder: Secondary | ICD-10-CM

## 2021-10-08 MED ORDER — BUPIVACAINE HCL 0.5 % IJ SOLN
7.0000 mL | INTRAMUSCULAR | Status: AC | PRN
  Administered 2021-10-08: 7 mL via INTRA_ARTICULAR

## 2021-10-08 MED ORDER — LIDOCAINE HCL 1 % IJ SOLN
3.0000 mL | INTRAMUSCULAR | Status: AC | PRN
  Administered 2021-10-08: 3 mL

## 2021-10-12 ENCOUNTER — Ambulatory Visit (INDEPENDENT_AMBULATORY_CARE_PROVIDER_SITE_OTHER): Payer: Self-pay | Admitting: Orthopedic Surgery

## 2021-10-12 ENCOUNTER — Ambulatory Visit: Payer: Self-pay

## 2021-10-12 DIAGNOSIS — M7541 Impingement syndrome of right shoulder: Secondary | ICD-10-CM

## 2021-10-12 DIAGNOSIS — M19011 Primary osteoarthritis, right shoulder: Secondary | ICD-10-CM

## 2021-10-13 ENCOUNTER — Ambulatory Visit (INDEPENDENT_AMBULATORY_CARE_PROVIDER_SITE_OTHER): Payer: Medicaid Other | Admitting: Primary Care

## 2021-10-13 ENCOUNTER — Encounter: Payer: Self-pay | Admitting: Orthopedic Surgery

## 2021-10-13 MED ORDER — BUPIVACAINE HCL 0.25 % IJ SOLN
0.6600 mL | INTRAMUSCULAR | Status: AC | PRN
  Administered 2021-10-12: .66 mL via INTRA_ARTICULAR

## 2021-10-13 MED ORDER — LIDOCAINE HCL 1 % IJ SOLN
3.0000 mL | INTRAMUSCULAR | Status: AC | PRN
  Administered 2021-10-12: 3 mL

## 2021-10-13 MED ORDER — METHYLPREDNISOLONE ACETATE 40 MG/ML IJ SUSP
13.3300 mg | INTRAMUSCULAR | Status: AC | PRN
  Administered 2021-10-12: 13.33 mg via INTRA_ARTICULAR

## 2021-10-13 MED ORDER — TRAMADOL HCL 50 MG PO TABS: 50.0000 mg | ORAL_TABLET | Freq: Four times a day (QID) | ORAL | 0 refills | Status: DC | PRN

## 2021-10-13 NOTE — Progress Notes (Signed)
Office Visit Note   Patient: David Eaton           Date of Birth: 1980-07-16           MRN: [id number] Visit Date: 10/12/2021 Requested by: Grayce Sessions, NP [address] [place],  [place] [address] PCP: Grayce Sessions, NP  Subjective: Chief Complaint  Patient presents with   Right Shoulder - Pain    HPI: David Eaton is a 41 year old patient with right shoulder pain which has been going on for about a month and is getting worse.  Pain wakes him from sleep at night.  Reports shoulder weakness.  He is right-hand dominant.  Recently had to quit his job because of the pain.  Hard for him to sleep.  Cannot pick up boards.  Had an injury at [employer] almost a year ago.  The shoulder was pulled during the event in the bathroom.  Physical therapy has been no help.  He has had an MRI scan which is reviewed.  Shows partial-thickness rotator cuff tearing along with intense edema around the [facility] joint.  Has been taking ibuprofen with minimal relief.              ROS: All systems reviewed are negative as they relate to the chief complaint within the history of present illness.  Patient denies  fevers or chills.   Assessment & Plan: Visit Diagnoses:  1. 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  Plan is all TRAM to try at night.  Ultrasound-guided right AC joint injection performed today for diagnostic and therapeutic purposes.  4-week return with decision for or against surgical intervention at that time.  Follow-Up Instructions: No follow-ups on file.   Orders:  Orders Placed This Encounter  Procedures   US Guided Needle Placement - No Linked Charges   No orders of the defined types were placed in this encounter.     Procedures: Medium Joint Inj: R acromioclavicular on 10/12/2021 8:12 AM Indications: diagnostic evaluation and pain Details: 25 G 1.5 in needle,  ultrasound-guided superior approach Medications: 3 mL lidocaine 1 %; 0.66 mL bupivacaine 0.25 %; 13.33 mg methylPREDNISolone acetate 40 MG/ML Outcome: tolerated well, no immediate complications Procedure, treatment alternatives, risks and benefits explained, specific risks discussed. Consent was given by the patient. Immediately prior to procedure a time out was called to verify the correct patient, procedure, equipment, support staff and site/side marked as required. Patient was prepped and draped in the usual sterile fashion.       Clinical Data: No additional findings.  Objective: Vital Signs: There were no vitals taken for this visit.  Physical Exam:   Constitutional: Patient appears well-developed HEENT:  Head: Normocephalic Eyes:EOM are normal Neck: Normal range of motion Cardiovascular: Normal rate Pulmonary/chest: Effort normal Neurologic: Patient is alert Skin: Skin is warm Psychiatric: Patient has normal mood and affect   Ortho Exam: Ortho exam demonstrates tenderness to palpation around the [facility] joint.  No pain with resisted supination bilaterally of the hand.  Minimal tenderness around the biceps muscle itself.  Rotator cuff strength is good infraspinatus supraspinatus and subscap muscle testing.  No restriction of external rotation of 15 degrees of abduction.  No other masses lymphadenopathy or skin changes noted in the shoulder girdle region  Specialty Comments:  No specialty comments available.  Imaging: US Guided Needle Placement - No Linked Charges  Result Date: 10/13/2021  Ultrasound imaging demonstrates needle placement in THE [facility] JOINT WITH EXTRAVASATION OF FLUID AND NO COMPLICATING FEATURES    PMFS History: Patient Active Problem List   Diagnosis Date Noted   Umbilical hernia without obstruction and without gangrene 11/12/2018   Obstructive sleep apnea syndrome 02/02/2011   Diabetes mellitus, type 2 ([place]) 10/31/2010   Essential hypertension 10/31/2010    Morbid obesity ([place]) 10/31/2010   Past Medical History:  Diagnosis Date   Diabetes mellitus without complication ([place])    Hypertension     History reviewed. No pertinent family history.  History reviewed. No pertinent surgical history. Social History   Occupational History   Not on file  Tobacco Use   Smoking status: Every Day    Types: Cigarettes   Smokeless tobacco: Never  Vaping Use   Vaping Use: Never used  Substance and Sexual Activity   Alcohol use: Not Currently   Drug use: Not Currently   Sexual activity: Not on file

## 2021-10-20 ENCOUNTER — Encounter (INDEPENDENT_AMBULATORY_CARE_PROVIDER_SITE_OTHER): Payer: Self-pay | Admitting: Primary Care

## 2021-10-20 ENCOUNTER — Ambulatory Visit (INDEPENDENT_AMBULATORY_CARE_PROVIDER_SITE_OTHER): Payer: Medicaid Other | Admitting: Primary Care

## 2021-10-20 VITALS — BP 188/103 | HR 116 | Resp 16 | Ht 65.0 in | Wt 265.8 lb

## 2021-10-20 DIAGNOSIS — Z0000 Encounter for general adult medical examination without abnormal findings: Secondary | ICD-10-CM

## 2021-10-20 DIAGNOSIS — I10 Essential (primary) hypertension: Secondary | ICD-10-CM

## 2021-10-20 DIAGNOSIS — F5102 Adjustment insomnia: Secondary | ICD-10-CM | POA: Diagnosis not present

## 2021-10-20 DIAGNOSIS — F4323 Adjustment disorder with mixed anxiety and depressed mood: Secondary | ICD-10-CM

## 2021-10-20 DIAGNOSIS — E119 Type 2 diabetes mellitus without complications: Secondary | ICD-10-CM

## 2021-10-20 DIAGNOSIS — Z6841 Body Mass Index (BMI) 40.0 and over, adult: Secondary | ICD-10-CM

## 2021-10-20 LAB — POCT GLYCOSYLATED HEMOGLOBIN (HGB A1C): HbA1c, POC (controlled diabetic range): 9.6 % — AB (ref 0.0–7.0)

## 2021-10-20 MED ORDER — VALSARTAN-HYDROCHLOROTHIAZIDE 160-25 MG PO TABS: 1.0000 | ORAL_TABLET | Freq: Every day | ORAL | 3 refills | Status: DC

## 2021-10-20 MED ORDER — ESCITALOPRAM OXALATE 20 MG PO TABS: 20.0000 mg | ORAL_TABLET | Freq: Every day | ORAL | 1 refills | Status: DC

## 2021-10-20 NOTE — Progress Notes (Signed)
9/6

## 2021-10-20 NOTE — Patient Instructions (Signed)
Insomnia Insomnia is a sleep disorder that makes it difficult to fall asleep or stay asleep. Insomnia can cause fatigue, low energy, difficulty concentrating, mood swings, and poor performance at work or school. There are three different ways to classify insomnia: Difficulty falling asleep. Difficulty staying asleep. Waking up too early in the morning. Any type of insomnia can be long-term (chronic) or short-term (acute). Both are common. Short-term insomnia usually lasts for 3 months or less. Chronic insomnia occurs at least three times a week for longer than 3 months. What are the causes? Insomnia may be caused by another condition, situation, or substance, such as: Having certain mental health conditions, such as anxiety and depression. Using caffeine, alcohol, tobacco, or drugs. Having gastrointestinal conditions, such as gastroesophageal reflux disease (GERD). Having certain medical conditions. These include: Asthma. Alzheimer's disease. Stroke. Chronic pain. An overactive thyroid gland (hyperthyroidism). Other sleep disorders, such as restless legs syndrome and sleep apnea. Menopause. Sometimes, the cause of insomnia may not be known. What increases the risk? Risk factors for insomnia include: Gender. Females are affected more often than males. Age. Insomnia is more common as people get older. Stress and certain medical and mental health conditions. Lack of exercise. Having an irregular work schedule. This may include working night shifts and traveling between different time zones. What are the signs or symptoms? If you have insomnia, the main symptom is having trouble falling asleep or having trouble staying asleep. This may lead to other symptoms, such as: Feeling tired or having low energy. Feeling nervous about going to sleep. Not feeling rested in the morning. Having trouble concentrating. Feeling irritable, anxious, or depressed. How is this diagnosed? This condition  may be diagnosed based on: Your symptoms and medical history. Your health care provider may ask about: Your sleep habits. Any medical conditions you have. Your mental health. A physical exam. How is this treated? Treatment for insomnia depends on the cause. Treatment may focus on treating an underlying condition that is causing the insomnia. Treatment may also include: Medicines to help you sleep. Counseling or therapy. Lifestyle adjustments to help you sleep better. Follow these instructions at home: Eating and drinking  Limit or avoid alcohol, caffeinated beverages, and products that contain nicotine and tobacco, especially close to bedtime. These can disrupt your sleep. Do not eat a large meal or eat spicy foods right before bedtime. This can lead to digestive discomfort that can make it hard for you to sleep. Sleep habits  Keep a sleep diary to help you and your health care provider figure out what could be causing your insomnia. Write down: When you sleep. When you wake up during the night. How well you sleep and how rested you feel the next day. Any side effects of medicines you are taking. What you eat and drink. Make your bedroom a dark, comfortable place where it is easy to fall asleep. Put up shades or blackout curtains to block light from outside. Use a white noise machine to block noise. Keep the temperature cool. Limit screen use before bedtime. This includes: Not watching TV. Not using your smartphone, tablet, or computer. Stick to a routine that includes going to bed and waking up at the same times every day and night. This can help you fall asleep faster. Consider making a quiet activity, such as reading, part of your nighttime routine. Try to avoid taking naps during the day so that you sleep better at night. Get out of bed if you are still awake after   15 minutes of trying to sleep. Keep the lights down, but try reading or doing a quiet activity. When you feel  sleepy, go back to bed. General instructions Take over-the-counter and prescription medicines only as told by your health care provider. Exercise regularly as told by your health care provider. However, avoid exercising in the hours right before bedtime. Use relaxation techniques to manage stress. Ask your health care provider to suggest some techniques that may work well for you. These may include: Breathing exercises. Routines to release muscle tension. Visualizing peaceful scenes. Make sure that you drive carefully. Do not drive if you feel very sleepy. Keep all follow-up visits. This is important. Contact a health care provider if: You are tired throughout the day. You have trouble in your daily routine due to sleepiness. You continue to have sleep problems, or your sleep problems get worse. Get help right away if: You have thoughts about hurting yourself or someone else. Get help right away if you feel like you may hurt yourself or others, or have thoughts about taking your own life. Go to your nearest emergency room or: Call 911. Call the National Suicide Prevention Lifeline at 1-800-273-8255 or 988. This is open 24 hours a day. Text the Crisis Text Line at 741741. Summary Insomnia is a sleep disorder that makes it difficult to fall asleep or stay asleep. Insomnia can be long-term (chronic) or short-term (acute). Treatment for insomnia depends on the cause. Treatment may focus on treating an underlying condition that is causing the insomnia. Keep a sleep diary to help you and your health care provider figure out what could be causing your insomnia. This information is not intended to replace advice given to you by your health care provider. Make sure you discuss any questions you have with your health care provider. Document Revised: 12/13/2020 Document Reviewed: 12/13/2020 Elsevier Patient Education  2023 [employer]vier Inc.  

## 2021-10-20 NOTE — Progress Notes (Addendum)
.me  Established Patient Office Visit  Subjective   Patient ID: David Eaton, male    DOB: 12/29/1980  Age: 41 y.o. MRN: [id number]  Chief Complaint  Patient presents with   Hypertension   Diabetes    HPI [name] is a 41 year old obese male that has not been seen in over 2 years.  He has a significant history of diabetes and hypertension and admits to not being on any medication. He thought that significant significant amount of weight loss was accomplished and he no longer needed medication.  He has noticed weight slowly crept back up and he was not exercising and eating healthy anymore. 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  He also admits to polyuria, polydipsia, polyphasia. Patient has No headache, No chest pain, No abdominal pain - No Nausea, No new weakness tingling or numbness, No Cough - shortness of breath.  Question patient where has he been stated nowhere moved further out from the office and no longer felt the need to be  ROS Comprehensive ROS Pertinent positive and negative noted in HPI     Objective:    BP (!) 188/103   Pulse (!) 116   Resp 16   Ht 5' 5" (1.651 m)   Wt 265 lb 12.8 oz (120.6 kg)   SpO2 95%   BMI 44.23 kg/m  BP Readings from Last 3 Encounters:  10/20/21 (!) 188/103  10/06/21 (!) 158/95  10/05/21 (!) 171/106   Physical exam: General: Vital signs reviewed.  Patient is well-developed and well-nourished, obese male on no acute distress and cooperative with exam. Head: Normocephalic and atraumatic. Eyes: EOMI, conjunctivae normal, no scleral icterus. Neck: Supple, trachea midline, normal ROM, no JVD, masses, thyromegaly, or carotid bruit present. Cardiovascular: RRR, S1 normal, S2 normal, no murmurs, gallops, or rubs. Pulmonary/Chest: Clear to auscultation bilaterally, no wheezes, rales, or rhonchi. Abdominal:  Soft, non-tender, non-distended, BS + umbilical hernia  musculoskeletal: No joint deformities, erythema, or stiffness, ROM full and nontender. Extremities: No lower extremity edema bilaterally,   Neurological: A&O x3, Strength is normal Skin: Warm, dry and intact. No rashes or erythema. Psychiatric: Normal mood and affect. speech and behavior is normal. Cognition and memory are normal.     Results for orders placed or performed in visit on 10/20/21  Microalbumin / creatinine urine ratio  Result Value Ref Range   Creatinine, Urine 94.4 Not Estab. mg/dL   Microalbumin, Urine 262.2 Not Estab. ug/mL   Microalb/Creat Ratio 278 (H) 0 - 29 mg/g creat  HIV Antibody (routine testing w rflx)  Result Value Ref Range   HIV Screen 4th Generation wRfx Non Reactive Non Reactive  HCV Ab w Reflex to Quant PCR  Result Value Ref Range   HCV Ab Non Reactive Non Reactive  CBC  Result Value Ref Range   WBC 10.6 3.4 - 10.8 x10E3/uL   RBC 4.85 4.14 - 5.80 x10E6/uL   Hemoglobin 15.0 13.0 - 17.7 g/dL   Hematocrit 42.9 37.5 - 51.0 %   MCV 89 79 - 97 fL   MCH 30.9 26.6 - 33.0 pg   MCHC 35.0 31.5 - 35.7 g/dL   RDW 14.2 11.6 - 15.4 %   Platelets 390 150 - 450 x10E3/uL  CMP14+EGFR  Result Value Ref Range   Glucose 328 (H) 70 - 99 mg/dL   BUN 20 6 -  24 mg/dL   Creatinine, Ser 1.33 (H) 0.76 - 1.27 mg/dL   eGFR 69 >59 mL/min/1.73   BUN/Creatinine Ratio 15 9 - 20   Sodium 135 134 - 144 mmol/L   Potassium 4.8 3.5 - 5.2 mmol/L   Chloride 97 96 - 106 mmol/L   CO2 22 20 - 29 mmol/L   Calcium 10.6 (H) 8.7 - 10.2 mg/dL   Total Protein 8.1 6.0 - 8.5 g/dL   Albumin 4.8 4.1 - 5.1 g/dL   Globulin, Total 3.3 1.5 - 4.5 g/dL   Albumin/Globulin Ratio 1.5 1.2 - 2.2   Bilirubin Total 0.4 0.0 - 1.2 mg/dL   Alkaline Phosphatase 139 (H) 44 - 121 IU/L   AST 18 0 - 40 IU/L   ALT 30 0 - 44 IU/L  Lipid panel  Result Value Ref Range   Cholesterol, Total 341 (H) 100 - 199 mg/dL   Triglycerides 513 (H) 0 - 149 mg/dL   HDL  63 >39 mg/dL   VLDL Cholesterol Cal 103 (H) 5 - 40 mg/dL   LDL Chol Calc (NIH) 175 (H) 0 - 99 mg/dL   Chol/HDL Ratio 5.4 (H) 0.0 - 5.0 ratio  Interpretation:  Result Value Ref Range   HCV Interp 1: Comment   POCT glycosylated hemoglobin (Hb A1C)  Result Value Ref Range   Hemoglobin A1C     HbA1c POC (<> result, manual entry)     HbA1c, POC (prediabetic range)     HbA1c, POC (controlled diabetic range) 9.6 (A) 0.0 - 7.0 %    The ASCVD Risk score (Arnett DK, et al., 2019) failed to calculate for the following reasons:   The valid total cholesterol range is 130 to 320 mg/dL    Assessment & Plan:  David Eaton was seen today for hypertension and diabetes.  Diagnoses and all orders for this visit: Essential hypertension BP goal - < 130/80 Explained that having normal blood pressure is the goal and medications are helping to get to goal and maintain normal blood pressure. DIET: Limit salt intake, read nutrition labels to check salt content, limit fried and high fatty foods  Avoid using multisymptom OTC cold preparations that generally contain sudafed which can rise BP. Consult with pharmacist on best cold relief products to use for persons with HTN EXERCISE Discussed incorporating exercise such as walking - 30 minutes most days of the week and can do in 10 minute intervals    -     CMP14+EGFR -     valsartan-hydrochlorothiazide (DIOVAN-HCT) 160-25 MG tablet; Take 1 tablet by mouth daily.  Type 2 diabetes mellitus without complication, without long-term current use of insulin (HCC) Discussed  co- morbidities with uncontrol diabetes  Complications -diabetic retinopathy, (close your eyes ? What do you see nothing) nephropathy decrease in kidney function- can lead to dialysis-on a machine 3 days a week to filter your kidney, neuropathy- numbness and tinging in your hands and feet,  increase risk of heart attack and stroke, and amputation due to decrease wound healing and circulation. Decrease your  risk by taking medication daily as prescribed, monitor carbohydrates- foods that are high in carbohydrates are the following rice, potatoes, breads, sugars, and pastas.  Reduction in the intake (eating) will assist in lowering your blood sugars. Exercise daily at least 30 minutes daily. Demonstrated how to use Ozempic patient return demonstration Add metformin at 1000 mg twice daily and Ozempic , will follow-up with clinical pharmacist -     POCT glycosylated hemoglobin (Hb A1C) -       Microalbumin / creatinine urine ratio -     CBC -     Lipid panel -     Ambulatory referral to Ophthalmology  Healthcare maintenance -     HIV Antibody (routine testing w rflx) -     HCV Ab w Reflex to Quant PCR  Adjustment disorder with mixed anxiety and depressed mood  -     escitalopram (LEXAPRO) 20 MG tablet; Take 1 tablet (20 mg total) by mouth daily. Adjustment insomnia  -     escitalopram (LEXAPRO) 20 MG tablet; Take 1 tablet (20 mg total) by mouth daily. Other orders -     escitalopram (LEXAPRO) 20 MG tablet; Take 1 tablet (20 mg total) by mouth daily. -     Interpretation: -     Insulin Glargine (BASAGLAR KWIKPEN) 100 UNIT/ML; Inject 24 Units into the skin at bedtime   Morbid obesity (HCC) BMI greater than 40 wits may be underlying contributors to hypertension and diabetes. - educated on lifestyle modifications, including but not limited to diet choices and adding exercise to daily routine.    Kerin Perna, NP

## 2021-10-21 ENCOUNTER — Other Ambulatory Visit: Payer: Self-pay | Admitting: Pharmacist

## 2021-10-21 LAB — CMP14+EGFR
ALT: 30 IU/L (ref 0–44)
AST: 18 IU/L (ref 0–40)
Albumin/Globulin Ratio: 1.5 (ref 1.2–2.2)
Albumin: 4.8 g/dL (ref 4.1–5.1)
Alkaline Phosphatase: 139 IU/L — ABNORMAL HIGH (ref 44–121)
BUN/Creatinine Ratio: 15 (ref 9–20)
BUN: 20 mg/dL (ref 6–24)
Bilirubin Total: 0.4 mg/dL (ref 0.0–1.2)
CO2: 22 mmol/L (ref 20–29)
Calcium: 10.6 mg/dL — ABNORMAL HIGH (ref 8.7–10.2)
Chloride: 97 mmol/L (ref 96–106)
Creatinine, Ser: 1.33 mg/dL — ABNORMAL HIGH (ref 0.76–1.27)
Globulin, Total: 3.3 g/dL (ref 1.5–4.5)
Glucose: 328 mg/dL — ABNORMAL HIGH (ref 70–99)
Potassium: 4.8 mmol/L (ref 3.5–5.2)
Sodium: 135 mmol/L (ref 134–144)
Total Protein: 8.1 g/dL (ref 6.0–8.5)
eGFR: 69 mL/min/{1.73_m2} (ref >59)

## 2021-10-21 LAB — HCV INTERPRETATION

## 2021-10-21 LAB — HIV ANTIBODY (ROUTINE TESTING W REFLEX): HIV Screen 4th Generation wRfx: NONREACTIVE

## 2021-10-21 LAB — CBC
Hematocrit: 42.9 % (ref 37.5–51.0)
Hemoglobin: 15 g/dL (ref 13.0–17.7)
MCH: 30.9 pg (ref 26.6–33.0)
MCHC: 35 g/dL (ref 31.5–35.7)
MCV: 89 fL (ref 79–97)
Platelets: 390 10*3/uL (ref 150–450)
RBC: 4.85 x10E6/uL (ref 4.14–5.80)
RDW: 14.2 % (ref 11.6–15.4)
WBC: 10.6 10*3/uL (ref 3.4–10.8)

## 2021-10-21 LAB — MICROALBUMIN / CREATININE URINE RATIO
Creatinine, Urine: 94.4 mg/dL
Microalb/Creat Ratio: 278 mg/g creat — ABNORMAL HIGH (ref 0–29)
Microalbumin, Urine: 262.2 ug/mL

## 2021-10-21 LAB — LIPID PANEL
Chol/HDL Ratio: 5.4 ratio — ABNORMAL HIGH (ref 0.0–5.0)
Cholesterol, Total: 341 mg/dL — ABNORMAL HIGH (ref 100–199)
HDL: 63 mg/dL (ref >39)
LDL Chol Calc (NIH): 175 mg/dL — ABNORMAL HIGH (ref 0–99)
Triglycerides: 513 mg/dL — ABNORMAL HIGH (ref 0–149)
VLDL Cholesterol Cal: 103 mg/dL — ABNORMAL HIGH (ref 5–40)

## 2021-10-21 LAB — HCV AB W REFLEX TO QUANT PCR: HCV Ab: NONREACTIVE

## 2021-10-21 MED ORDER — OZEMPIC (0.25 OR 0.5 MG/DOSE) 2 MG/3ML ~~LOC~~ SOPN: 0.2500 mg | PEN_INJECTOR | SUBCUTANEOUS | 1 refills | Status: DC

## 2021-10-24 ENCOUNTER — Other Ambulatory Visit: Payer: Self-pay

## 2021-10-25 ENCOUNTER — Other Ambulatory Visit: Payer: Self-pay

## 2021-10-25 MED ORDER — BASAGLAR KWIKPEN 100 UNIT/ML ~~LOC~~ SOPN: 24.0000 [IU] | PEN_INJECTOR | Freq: Every day | SUBCUTANEOUS | 1 refills | Status: DC

## 2021-10-27 ENCOUNTER — Other Ambulatory Visit: Payer: Self-pay

## 2021-11-03 ENCOUNTER — Ambulatory Visit (INDEPENDENT_AMBULATORY_CARE_PROVIDER_SITE_OTHER): Payer: Medicaid Other | Admitting: Primary Care

## 2021-11-09 ENCOUNTER — Ambulatory Visit: Payer: Medicaid Other | Admitting: Orthopedic Surgery

## 2021-11-10 ENCOUNTER — Ambulatory Visit (INDEPENDENT_AMBULATORY_CARE_PROVIDER_SITE_OTHER): Payer: Self-pay

## 2021-11-10 NOTE — Telephone Encounter (Signed)
Message from Roslynn Amble sent at 11/10/2021  3:36 PM EDT  Summary: Insulin questions?   The patient called in stating he would like to talk to his provider about his medication. He had recent blood work done and his provider changed his insulin up. He picked it up yesterday but he hasn't started taking it yet. He wants to know if he should stop taking the insulin he has been taking or take both now that he has the new one. Please assist further         Called pt and LM on VM to call back to discuss current treatment.

## 2021-11-10 NOTE — Telephone Encounter (Signed)
Will forward to provider  

## 2021-11-10 NOTE — Telephone Encounter (Signed)
Pt called again. He is still not comfortable with all of the medications he is taking. He would like a call back from provider to assuage his worry.  Pt will also start to measuring his blood glucose levels.

## 2021-11-10 NOTE — Telephone Encounter (Signed)
   Chief Complaint: medication clarification  Symptoms:    Frequency:   Pertinent Negatives: Patient denies   Disposition: [] ED /[] Urgent Care (no appt availability in office) / [] Appointment(In office/virtual)/ []  [place] [facility]/ [x] Home Care/ [] Refused Recommended Disposition /[] [place] [facility]/ []  Follow-up with PCP Additional Notes: Patient wants to clarify- he is to take both injectable insulin medications- basaglar kwikpen and ozempic - advised yes as directed  Reason for Disposition  Caller has medicine question only, adult not sick, AND triager answers question  Answer Assessment - Initial Assessment Questions 1. NAME of MEDICINE: "What medicine(s) are you calling about?"     Ozempic and Basaglar  2. QUESTION: "What is your question?" (e.g., double dose of medicine, side effect)     Patient wants to make sure he is to take both- the once weekly and the nightly medications 3. PRESCRIBER: "Who prescribed the medicine?" Reason: if prescribed by specialist, call should be referred to that group.     PCP  Patient advised per OV- yes  Protocols used: Medication Question Call-A-AH

## 2021-11-12 ENCOUNTER — Other Ambulatory Visit (INDEPENDENT_AMBULATORY_CARE_PROVIDER_SITE_OTHER): Payer: Self-pay | Admitting: Primary Care

## 2021-11-12 NOTE — Progress Notes (Signed)
Medication discussed on Lantus 24 units at bed time instead of Basiglar ,metformin 1000mg  bid and Ozempic .5mg  weekly.

## 2021-11-28 ENCOUNTER — Ambulatory Visit (INDEPENDENT_AMBULATORY_CARE_PROVIDER_SITE_OTHER): Payer: Self-pay | Admitting: Primary Care

## 2021-11-28 DIAGNOSIS — F4323 Adjustment disorder with mixed anxiety and depressed mood: Secondary | ICD-10-CM

## 2021-11-28 MED ORDER — BUPROPION HCL ER (SR) 100 MG PO TB12: 100.0000 mg | ORAL_TABLET | Freq: Two times a day (BID) | ORAL | 1 refills | Status: DC

## 2021-11-28 NOTE — Progress Notes (Signed)
[facility]  Telephone Note  I connected with David Eaton, on 11/28/2021 at 4:01 PM telephone and verified that I am speaking with the correct person using two identifiers.   Consent: I discussed the limitations, risks, security and privacy concerns of performing an evaluation and management service by telephone and the availability of in person appointments. I also discussed with the patient that there may be a patient responsible charge related to this service. The patient expressed understanding and agreed to proceed.   Location of Patient: Home   Location of Provider: [facility]   Persons participating in Telemedicine visit: Jamse Belfast,  NP  History of Present Illness: [name] is a 41 year old having a tele visit voices concerns with anxiety , depression and coping skills. He denies homicidal or suicidal thoughts, denies harm to self or other, denies auditory or visual hallucinations.  Requesting medication for treatment.   Past Medical History:  Diagnosis Date   Diabetes mellitus without complication (HCC)    Hypertension    No Known Allergies  Current Outpatient Medications on File Prior to Visit  Medication Sig Dispense Refill   amLODipine (NORVASC) 10 MG tablet Take 1 tablet (10 mg total) by mouth daily. 90 tablet 3   baclofen (LIORESAL) 10 MG tablet Take 0.5-1 tablets (5-10 mg total) by mouth 3 (three) times daily as needed for muscle spasms. 30 each 3   celecoxib (CELEBREX) 200 MG capsule Take 1 capsule (200 mg total) by mouth 2 (two) times daily as needed. 60 capsule 6   Insulin Glargine (BASAGLAR KWIKPEN) 100 UNIT/ML Inject 24 Units into the skin at bedtime. 15 mL 1   metFORMIN (GLUCOPHAGE) 1000 MG tablet Take 1 tablet (1,000 mg total) by mouth 2 (two) times daily with a meal. 180 tablet 3   methocarbamol (ROBAXIN) 500 MG tablet Take 1 tablet (500 mg total) by  mouth 2 (two) times daily. 20 tablet 0   Semaglutide,0.25 or 0.5MG /DOS, (OZEMPIC, 0.25 OR 0.5 MG/DOSE,) 2 MG/3ML SOPN Inject 0.25 mg into the skin once a week. For 4 weeks. Then, increase to 0.5 mg once a week thereafter. 3 mL 1   traMADol (ULTRAM) 50 MG tablet Take 1 tablet (50 mg total) by mouth every 6 (six) hours as needed. 30 tablet 0   traMADol (ULTRAM) 50 MG tablet Take 1 tablet (50 mg total) by mouth every 6 (six) hours as needed. 30 tablet 0   valsartan-hydrochlorothiazide (DIOVAN-HCT) 160-25 MG tablet Take 1 tablet by mouth daily. 90 tablet 3   No current facility-administered medications on file prior to visit.    Observations/Objective: There were no vitals taken for this visit.   Assessment and Plan: Diagnoses and all orders for this visit:  Adjustment disorder with mixed anxiety and depressed mood Initially explain would send in Prozac but counter interacted with other medications changed to Wellbutrin and will follow-up further effectiveness of the medication in 4 to 6 weeks call for an appointment.  Any adverse effects headaches, weight loss, dry mouth, insomnia, nausea, dizziness, constipation, palpitations or sore throat call office.  Other orders -     buPROPion ER (WELLBUTRIN SR) 100 MG 12 hr tablet; Take 1 tablet (100 mg total) by mouth 2 (two) times daily.     Follow Up Instructions: Schedule 4 to 6 weeks   I discussed the assessment and treatment plan with the patient. The patient was provided an opportunity to ask questions  and all were answered. The patient agreed with the plan and demonstrated an understanding of the instructions.   The patient was advised to call back or seek an in-person evaluation if the symptoms worsen or if the condition fails to improve as anticipated.     I provided 15 minutes total of non-face-to-face time during this encounter including median intraservice time, reviewing previous notes, investigations, ordering medications, medical  decision making, coordinating care and patient verbalized understanding at the end of the visit.    This note has been created with Surveyor, quantity. Any transcriptional errors are unintentional.   Kerin Perna, NP 11/28/2021, 4:01 PM

## 2021-11-30 ENCOUNTER — Ambulatory Visit (INDEPENDENT_AMBULATORY_CARE_PROVIDER_SITE_OTHER): Payer: Self-pay | Admitting: *Deleted

## 2021-11-30 NOTE — Telephone Encounter (Signed)
DPR , Tiffany called requesting to review all of patient current listed medications. Patient currently being treated at [facility] ED. Tiffany requesting all medications be sent to a different pharmacy than original pharmacy. Tiffany reports patient see 11/28/21 and was told he would start prozac and medication is not listed . Wellbutrin SR 100 mg listed. Report is that patient take lantus insulin and unaware patient taking basaglar and reports needles also need to be ordered. Please advise and confirm correct medications patient is to be on. Tiffany requesting medication to be sent to [facility] at this time .    Reason for Disposition  [1] Caller has NON-URGENT medicine question about med that PCP prescribed AND [2] triager unable to answer question  Answer Assessment - Initial Assessment Questions 1. NAME of MEDICINE: "What medicine(s) are you calling about?"     All medications . Requesting if patient started on prozac instead of wellbutrin 2. QUESTION: "What is your question?" (e.g., double dose of medicine, side effect)     DPR , Tiffany requesting a list of current medications patient is supposed to be taking. Reports some medications have been changed. 3. PRESCRIBER: "Who prescribed the medicine?" Reason: if prescribed by specialist, call should be referred to that group.     PCP 4. SYMPTOMS: "Do you have any symptoms?" If Yes, ask: "What symptoms are you having?"  "How bad are the symptoms (e.g., mild, moderate, severe)     Patient current in [facility] ED in [place]. 5. PREGNANCY:  "Is there any chance that you are pregnant?" "When was your last menstrual period?"     na  Protocols used: Medication Question Call-A-AH

## 2021-12-01 ENCOUNTER — Other Ambulatory Visit (INDEPENDENT_AMBULATORY_CARE_PROVIDER_SITE_OTHER): Payer: Self-pay | Admitting: Primary Care

## 2021-12-01 ENCOUNTER — Ambulatory Visit: Payer: Medicaid Other | Admitting: Pharmacist

## 2021-12-01 DIAGNOSIS — I10 Essential (primary) hypertension: Secondary | ICD-10-CM

## 2021-12-01 MED ORDER — PEN NEEDLES 32G X 4 MM MISC: 1.0000 | Freq: Two times a day (BID) | 6 refills | Status: AC

## 2021-12-01 MED ORDER — VALSARTAN-HYDROCHLOROTHIAZIDE 160-25 MG PO TABS: 1.0000 | ORAL_TABLET | Freq: Every day | ORAL | 1 refills | Status: DC

## 2021-12-01 MED ORDER — AMLODIPINE BESYLATE 10 MG PO TABS: 10.0000 mg | ORAL_TABLET | Freq: Every day | ORAL | 1 refills | Status: DC

## 2021-12-01 NOTE — Telephone Encounter (Signed)
Call patient for clarification of medications contraindication with Prozac changed to Wellbutrin.  Insurance would not pay for Basaglar changed to Lantus.  Patient is aware reviewed medications endorse will be taking all medication as prescribed we will see on follow-up

## 2021-12-01 NOTE — Telephone Encounter (Signed)
Will forward to provider  

## 2021-12-02 ENCOUNTER — Telehealth (INDEPENDENT_AMBULATORY_CARE_PROVIDER_SITE_OTHER): Payer: Self-pay | Admitting: Primary Care

## 2021-12-02 NOTE — Telephone Encounter (Signed)
Copied from CRM [id number]. Topic: General - Inquiry >> Dec 01, 2021 10:41 AM Tiffany B wrote: Reason for CRM: Caller checking on the status of  NT note sent to practice on 11/30/2021. Caller states patient is re using needles, and caller would like a follow up call today

## 2021-12-02 NOTE — Telephone Encounter (Signed)
Will forward to provider  

## 2021-12-05 NOTE — Telephone Encounter (Signed)
Spoke with patient regarding medication reviewed medication understood

## 2021-12-07 ENCOUNTER — Ambulatory Visit: Payer: Medicaid Other | Admitting: Orthopedic Surgery

## 2021-12-23 ENCOUNTER — Ambulatory Visit (INDEPENDENT_AMBULATORY_CARE_PROVIDER_SITE_OTHER): Payer: Medicaid Other | Admitting: Surgical

## 2021-12-23 ENCOUNTER — Ambulatory Visit: Payer: Self-pay

## 2021-12-23 DIAGNOSIS — M19011 Primary osteoarthritis, right shoulder: Secondary | ICD-10-CM | POA: Diagnosis not present

## 2021-12-25 ENCOUNTER — Encounter: Payer: Self-pay | Admitting: Orthopedic Surgery

## 2021-12-25 MED ORDER — BUPIVACAINE HCL 0.25 % IJ SOLN
0.6600 mL | INTRAMUSCULAR | Status: AC | PRN
  Administered 2021-12-23: .66 mL via INTRA_ARTICULAR

## 2021-12-25 MED ORDER — METHYLPREDNISOLONE ACETATE 40 MG/ML IJ SUSP
13.3300 mg | INTRAMUSCULAR | Status: AC | PRN
  Administered 2021-12-23: 13.33 mg via INTRA_ARTICULAR

## 2021-12-25 MED ORDER — LIDOCAINE HCL 1 % IJ SOLN
3.0000 mL | INTRAMUSCULAR | Status: AC | PRN
  Administered 2021-12-23: 3 mL

## 2021-12-25 NOTE — Progress Notes (Signed)
Office Visit Note   Patient: David Eaton           Date of Birth: 04-26-1980           MRN: [id number] Visit Date: 12/23/2021 Requested by: Grayce Sessions, NP [address] [place],  [place] [address] PCP: Grayce Sessions, NP  Subjective: Chief Complaint  Patient presents with   Right Shoulder - Follow-up    HPI: David Eaton is a 41 y.o. male who presents to the office reporting right shoulder pain.  Follows up after right AC joint injection on 10/12/2021 that gave him good relief until about 3 weeks ago.  He has history of right shoulder MRI demonstrating partial-thickness tear of the supraspinatus with moderate degenerative changes of the [facility] joint and marrow edema in the distal clavicle along with paralabral cysts formation.  States that pain is intense and severe again.  He is absolutely against surgery at this time.  No new injury.  Also complains of swelling in the right side of his neck with associated pain in this location.  Does not really have any posterior neck pain compared with the anterior pain he is experiencing.  No radiation down his arm..                ROS: All systems reviewed are negative as they relate to the chief complaint within the history of present illness.  Patient denies fevers or chills.  Assessment & Plan: Visit Diagnoses:  1. Arthritis of right acromioclavicular joint     Plan: Patient is a 41 year old male who presents for reevaluation of right shoulder pain.  He has primarily [facility] joint mediated pain as he had great relief from [facility] joint injection back in September.  He like to try 1 more injection today but would not like to proceed with any sort of surgical intervention at this time.  We can try 1 more injection but if this last about the same amount of time, would recommend proceeding with surgical intervention.  He understands.  Tolerated injection well which is administered under ultrasound guidance.  He also has more bothersome swelling  in the right side of his anterior neck.  Denies any history of cancer or any difficulty swallowing.  Is causing consistent pain and with the asymmetry compared with the left side, I recommended he follow-up with his PCP for further evaluation and potential imaging of this area.  He agreed with this plan.  Follow-up after injection wears off  Follow-Up Instructions: No follow-ups on file.   Orders:  Orders Placed This Encounter  Procedures   US Guided Needle Placement - No Linked Charges   No orders of the defined types were placed in this encounter.     Procedures: Medium Joint Inj: R acromioclavicular on 12/23/2021 11:42 AM Indications: diagnostic evaluation and pain Details: 25 G 1.5 in needle, ultrasound-guided superior approach Medications: 3 mL lidocaine 1 %; 0.66 mL bupivacaine 0.25 %; 13.33 mg methylPREDNISolone acetate 40 MG/ML Outcome: tolerated well, no immediate complications Procedure, treatment alternatives, risks and benefits explained, specific risks discussed. Consent was given by the patient. Immediately prior to procedure a time out was called to verify the correct patient, procedure, equipment, support staff and site/side marked as required. Patient was prepped and draped in the usual sterile fashion.       Clinical Data: No additional findings.  Objective: Vital Signs: There were no vitals taken for this visit.  Physical Exam:  Constitutional: Patient appears well-developed HEENT:  Head:  Normocephalic Eyes:EOM are normal Neck: Normal range of motion Cardiovascular: Normal rate Pulmonary/chest: Effort normal Neurologic: Patient is alert Skin: Skin is warm Psychiatric: Patient has normal mood and affect  Ortho Exam: Ortho exam demonstrates tenderness over the right AC joint.  Reproduction of pain with supraspinatus strength testing but he has good strength.  Good infraspinatus and subscapularis strength.  Full active and passive range of motion of the right  shoulder.  No cellulitis or skin changes noted.  He does have asymmetric swelling of the right side of his neck around the level of the thyroid but more lateral than the thyroid gland.  He is tender in this location.  There is no such swelling on the left side.  Specialty Comments:  No specialty comments available.  Imaging: No results found.   PMFS History: Patient Active Problem List   Diagnosis Date Noted   Umbilical hernia without obstruction and without gangrene 11/12/2018   Obstructive sleep apnea syndrome 02/02/2011   Diabetes mellitus, type 2 (HCC) 10/31/2010   Essential hypertension 10/31/2010   Morbid obesity (HCC) 10/31/2010   Past Medical History:  Diagnosis Date   Diabetes mellitus without complication (HCC)    Hypertension     No family history on file.  No past surgical history on file. Social History   Occupational History   Not on file  Tobacco Use   Smoking status: Every Day    Types: Cigarettes   Smokeless tobacco: Never  Vaping Use   Vaping Use: Never used  Substance and Sexual Activity   Alcohol use: Not Currently   Drug use: Not Currently   Sexual activity: Not on file

## 2021-12-28 ENCOUNTER — Telehealth: Payer: Self-pay | Admitting: Surgical

## 2021-12-28 NOTE — Telephone Encounter (Signed)
Patient states he was supposed to have a rx called in to [facility] in [place], pain medication. Patient never was told where to pick up or what medication was called [phone]

## 2021-12-29 ENCOUNTER — Other Ambulatory Visit: Payer: Self-pay | Admitting: Surgical

## 2021-12-29 MED ORDER — TRAMADOL HCL 50 MG PO TABS: 50.0000 mg | ORAL_TABLET | Freq: Two times a day (BID) | ORAL | 0 refills | Status: AC | PRN

## 2021-12-29 NOTE — Telephone Encounter (Signed)
Sent in RX for tramadol. No refills

## 2021-12-29 NOTE — Telephone Encounter (Signed)
LMMV meds sent

## 2022-01-06 ENCOUNTER — Ambulatory Visit (INDEPENDENT_AMBULATORY_CARE_PROVIDER_SITE_OTHER): Payer: Self-pay | Admitting: *Deleted

## 2022-01-06 NOTE — Telephone Encounter (Signed)
  Chief Complaint: neck pain and reports wellbutrin not helping with neck movements. Symptoms: right side of neck painful, swollen tissue reported, movable. Chronic pain and medications not helping with pain level. Reports not wanting to go out. Reports wellbutrin started and has not improved symptoms. Frequency: on going has seen orthopedics as well.12/23/21 Pertinent Negatives: Patient denies difficulty breathing no difficulty swallowing no fever.  Disposition: [] ED /[x] Urgent Care (no appt availability in office) / [] Appointment(In office/virtual)/ []  [facility]/ [] Home Care/ [] Refused Recommended Disposition /[] [place] [facility]/ []  Follow-up with PCP Additional Notes:   Recommended to go to UC/ED for pain. Appt already scheduled for 01/23/21. Please advise .    Reason for Disposition  [1] SEVERE neck pain (e.g., excruciating, unable to do any normal activities) AND [2] not improved after 2 hours of pain medicine  Answer Assessment - Initial Assessment Questions 1. ONSET: "When did the pain begin?"      On going has been seen by orthopedics 2. LOCATION: "Where does it hurt?"      Right side of neck swollen movable tissue reported causes pain and difficulty moving neck. 3. PATTERN "Does the pain come and go, or has it been constant since it started?"      Pain all of the time  4. SEVERITY: "How bad is the pain?"  (Scale 1-10; or mild, moderate, severe)   - NO PAIN (0): no pain or only slight stiffness    - MILD (1-3): doesn't interfere with normal activities    - MODERATE (4-7): interferes with normal activities or awakens from sleep    - SEVERE (8-10):  excruciating pain, unable to do any normal activities      Worsening  5. RADIATION: "Does the pain go anywhere else, shoot into your arms?"     na 6. CORD SYMPTOMS: "Any weakness or numbness of the arms or legs?"     na 7. CAUSE: "What do you think is causing the neck pain?"     Tissue on neck 8. NECK OVERUSE: "Any  recent activities that involved turning or twisting the neck?"     na 9. OTHER SYMPTOMS: "Do you have any other symptoms?" (e.g., headache, fever, chest pain, difficulty breathing, neck swelling)     Pain right side of neck tissue swollen movable tissue causes pain with movement. Medication wellbutrin not controlling head / neck movements.  10. PREGNANCY: "Is there any chance you are pregnant?" "When was your last menstrual period?"       na  Protocols used: Neck Pain or Stiffness-A-AH

## 2022-01-10 NOTE — Telephone Encounter (Signed)
Patient states he is unable to make it to the mobile unit today.   Please advise since current therapy is not helping or place referral.

## 2022-01-17 NOTE — Telephone Encounter (Signed)
Please have patient f/u with ortho

## 2022-01-20 ENCOUNTER — Telehealth (INDEPENDENT_AMBULATORY_CARE_PROVIDER_SITE_OTHER): Payer: Self-pay | Admitting: Primary Care

## 2022-01-20 NOTE — Telephone Encounter (Signed)
Attempted to reach patient to inform him of the visit on Monday would be virtual because provider is not in the office. Left a voicemail message.

## 2022-01-23 ENCOUNTER — Telehealth (INDEPENDENT_AMBULATORY_CARE_PROVIDER_SITE_OTHER): Payer: Medicaid Other | Admitting: Primary Care

## 2022-02-01 ENCOUNTER — Encounter: Payer: Self-pay | Admitting: Orthopedic Surgery

## 2022-02-01 ENCOUNTER — Ambulatory Visit (INDEPENDENT_AMBULATORY_CARE_PROVIDER_SITE_OTHER): Payer: Medicaid Other | Admitting: Orthopedic Surgery

## 2022-02-01 DIAGNOSIS — M19011 Primary osteoarthritis, right shoulder: Secondary | ICD-10-CM | POA: Diagnosis not present

## 2022-02-01 NOTE — Progress Notes (Addendum)
Office Visit Note   Patient: David Eaton           Date of Birth: [birth year]           MRN: [id number] Visit Date: 02/01/2022 Requested by: Kerin Perna, NP [address] [place],  [place] [address] PCP: Kerin Perna, NP  Subjective: Chief Complaint  Patient presents with   Right Shoulder - Follow-up    HPI: David Eaton is a 42 y.o. male who presents to the office reporting right shoulder pain.  His shoulder symptoms started as result of an injury sustained while he was   at [employer] about 2 years ago.  That is documented in the prior note.  He has had 2 AC joint injections 1 in September last year and 1 in December of last year.  About 90% relief with that [facility] joint injection and that relief has persisted.  He would like for this episode of injury and treatment to be concluded.  He may require surgery in the future to address his [facility] joint joint pathology..                ROS: All systems reviewed are negative as they relate to the chief complaint within the history of present illness.  Patient denies fevers or chills.  Assessment & Plan: Visit Diagnoses:  1. Arthritis of right acromioclavicular joint     Plan: Impression is currently minimally symptomatic AC joint arthritis.  Has had 2 injections.  2 injections generally is the limit per year for a joint injection.  Could consider further injection later next year if the relief is sustained.  Alternatively could consider arthroscopic distal clavicle excision for more permanent solution.  He will consider his options but for now we are going to close out this episode of care and he will follow-up as needed.  Follow-Up Instructions: No follow-ups on file.   Orders:  No orders of the defined types were placed in this encounter.  No orders of the defined types were placed in this encounter.     Procedures: No procedures performed   Clinical Data: No additional findings.  Objective: Vital Signs: There  were no vitals taken for this visit.  Physical Exam:  Constitutional: Patient appears well-developed HEENT:  Head: Normocephalic Eyes:EOM are normal Neck: Normal range of motion Cardiovascular: Normal rate Pulmonary/chest: Effort normal Neurologic: Patient is alert Skin: Skin is warm Psychiatric: Patient has normal mood and affect  Ortho Exam: Ortho exam demonstrates good cervical spine range of motion.  Has mild tenderness AC joint on the right compared to the left.  No pain with crossarm adduction today.  Not too much crepitus at the [facility] joint with passive range of motion.  Rotator cuff strength is excellent on the right infraspinatus supraspinatus and subscap muscle testing with no biceps tendon tenderness and negative O'Brien's testing.  Specialty Comments:  No specialty comments available.  Imaging: No results found.   PMFS History: Patient Active Problem List   Diagnosis Date Noted   Umbilical hernia without obstruction and without gangrene 11/12/2018   Obstructive sleep apnea syndrome 02/02/2011   Diabetes mellitus, type 2 ([place]) 10/31/2010   Essential hypertension 10/31/2010   Morbid obesity ([place]) 10/31/2010   Past Medical History:  Diagnosis Date   Diabetes mellitus without complication ([place])    Hypertension     No family history on file.  No past surgical history on file. Social History   Occupational History   Not on file  Tobacco Use   Smoking status: Every Day    Types: Cigarettes   Smokeless tobacco: Never  Vaping Use   Vaping Use: Never used  Substance and Sexual Activity   Alcohol use: Not Currently   Drug use: Not Currently   Sexual activity: Not on file

## 2022-02-06 ENCOUNTER — Telehealth: Payer: Self-pay

## 2022-02-06 NOTE — Telephone Encounter (Signed)
Patient called stating that most recent office note is incorrect.  Stated that shoulder symptoms started as a result to an incident that happened at the [employer].  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  CB# [phone] or [phone].  Please advise.  Thank you.

## 2022-02-06 NOTE — Telephone Encounter (Signed)
done

## 2022-02-07 ENCOUNTER — Other Ambulatory Visit (HOSPITAL_COMMUNITY): Payer: Self-pay

## 2022-02-07 NOTE — Telephone Encounter (Signed)
IC LMVM advising addendum had been made

## 2022-02-08 ENCOUNTER — Telehealth: Payer: Self-pay | Admitting: Orthopedic Surgery

## 2022-02-08 NOTE — Telephone Encounter (Signed)
Patient office notes states patient was injured at [employer] as an employee. 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 Please make changes to notes and advise patient.

## 2022-02-09 NOTE — Telephone Encounter (Signed)
Noted office notes dating from 66 onward showed that he was not an employee.  Stated that he was just at [place] when he was injured.

## 2022-02-09 NOTE — Telephone Encounter (Signed)
Dr Marlou Sa had made this addendum prior to this phone call as requested previously. I had tried calling patient to notify this had been done but was unable to reach patient.

## 2022-02-10 ENCOUNTER — Telehealth: Payer: Self-pay | Admitting: Orthopedic Surgery

## 2022-02-10 ENCOUNTER — Telehealth: Payer: Self-pay | Admitting: Emergency Medicine

## 2022-02-10 ENCOUNTER — Encounter: Payer: Self-pay | Admitting: Orthopedic Surgery

## 2022-02-10 NOTE — Telephone Encounter (Signed)
Pt called requesting a call back from Dr Marlou Sa. Pt states Dr Marlou Sa wrote him a letter for a pending lawsuit but the letter is wording wrong. Pt states Dr dean stated he worked at [employer] for 2 years. 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 Pt is asking for Dr Marlou Sa to call him so they can discuss what needs to be corrected in letter and pick up updated letter. Please call pt at [phone].

## 2022-02-10 NOTE — Telephone Encounter (Signed)
Copied from [place] [phone]. Topic: Appointment Scheduling - Scheduling Inquiry for Clinic >> Feb 10, 2022  1:41 PM Erskine Squibb wrote: Reason for CRM: The patient was supposed to talk to his provider on the 8th but he never did get to talk with her. Patient will reschedule his appt and wants his provider to k now he has been sweating a lot lately as well. Patient will also be calling his pharmacy to send refills and has been splitting doses to last.

## 2022-02-10 NOTE — Telephone Encounter (Signed)
Just responded to patients mychart message that he sent today as well.

## 2022-02-10 NOTE — Telephone Encounter (Signed)
Pt called requesting

## 2022-02-13 NOTE — Telephone Encounter (Signed)
Noted  

## 2022-02-16 ENCOUNTER — Other Ambulatory Visit (INDEPENDENT_AMBULATORY_CARE_PROVIDER_SITE_OTHER): Payer: Self-pay

## 2022-02-16 ENCOUNTER — Ambulatory Visit (INDEPENDENT_AMBULATORY_CARE_PROVIDER_SITE_OTHER): Payer: Self-pay

## 2022-02-16 DIAGNOSIS — I10 Essential (primary) hypertension: Secondary | ICD-10-CM

## 2022-02-16 MED ORDER — OZEMPIC (0.25 OR 0.5 MG/DOSE) 2 MG/3ML ~~LOC~~ SOPN: 0.2500 mg | PEN_INJECTOR | SUBCUTANEOUS | 1 refills | Status: DC

## 2022-02-16 MED ORDER — BASAGLAR KWIKPEN 100 UNIT/ML ~~LOC~~ SOPN: 24.0000 [IU] | PEN_INJECTOR | Freq: Every day | SUBCUTANEOUS | 1 refills | Status: DC

## 2022-02-16 NOTE — Telephone Encounter (Signed)
Requested Prescriptions  Pending Prescriptions Disp Refills   Semaglutide,0.25 or 0.5MG /DOS, (OZEMPIC, 0.25 OR 0.5 MG/DOSE,) 2 MG/3ML SOPN 3 mL 1    Sig: Inject 0.25 mg into the skin once a week. For 4 weeks. Then, increase to 0.5 mg once a week thereafter.     Endocrinology:  Diabetes - GLP-1 Receptor Agonists - semaglutide Failed - 02/16/2022 12:19 PM      Failed - HBA1C in normal range and within 180 days    HbA1c, POC (controlled diabetic range)  Date Value Ref Range Status  10/20/2021 9.6 (A) 0.0 - 7.0 % Final         Failed - Cr in normal range and within 360 days    Creatinine, Ser  Date Value Ref Range Status  10/20/2021 1.33 (H) 0.76 - 1.27 mg/dL Final         Passed - Valid encounter within last 6 months    Recent Outpatient Visits           2 months ago Adjustment disorder with mixed anxiety and depressed mood   [facility] Kerin Perna, NP   3 months ago Essential hypertension   [place], Michelle P, NP   1 year ago Essential hypertension   [place], [place], NP   1 year ago Essential hypertension   [place], Michelle P, NP       Future Appointments             In 1 week Kerin Perna, NP [facility] [facility]             Insulin Glargine (BASAGLAR KWIKPEN) 100 UNIT/ML 15 mL 1    Sig: Inject 24 Units into the skin at bedtime.     Endocrinology:  Diabetes - Insulins Failed - 02/16/2022 12:19 PM      Failed - HBA1C is between 0 and 7.9 and within 180 days    HbA1c, POC (controlled diabetic range)  Date Value Ref Range Status  10/20/2021 9.6 (A) 0.0 - 7.0 % Final         Passed - Valid encounter within last 6 months    Recent Outpatient Visits           2 months ago Adjustment disorder with mixed anxiety and depressed mood    [facility] Kerin Perna, NP   3 months ago Essential hypertension   [place], Michelle P, NP   1 year ago Essential hypertension   [place], Michelle P, NP   1 year ago Essential hypertension   [place], [place], NP       Future Appointments             In 1 week Oletta Lamas, [place] Cage, NP [place]

## 2022-02-16 NOTE — Telephone Encounter (Signed)
Will forward to provider  

## 2022-02-16 NOTE — Telephone Encounter (Signed)
Pt stated he has not heard back from pcp about refills needed for Insulin Glargine (BASAGLAR KWIKPEN) 100 UNIT/ML and  Semaglutide,0.25 or 0.5MG /DOS, (OZEMPIC, 0.25 OR 0.5 MG/DOSE,) 2 MG/3ML SOPN [[id number]]  He stated he hasn't taken meds in over a week / please advise asap

## 2022-02-16 NOTE — Telephone Encounter (Signed)
  Chief Complaint: Neck pain/depression/Missing medication Symptoms: above Frequency:  Pertinent Negatives: Patient denies  Disposition: [] ED /[] Urgent Care (no appt availability in office) / [] Appointment(In office/virtual)/ []  [facility]/ [] Home Care/ [] Refused Recommended Disposition /[]  Mobile Bus/ [x]  Follow-up with PCP Additional Notes: Pt called with several issues. Pt states he has tried several times to get DM medication refilled and it was denied. [facility].  Pt states that his neck hurts. This is the same issue he was seen at orthopedics for. Ortho referred him back to Pcp. Pt has lump on his neck and he cracks it constantly. Celesta Gentile states that pt is angry and not himself. Pt may need medication adjustment for mood.   Please advise.  PT lives an hour [place]/2 away and does not want to come into the office.    Reason for Disposition  [1] MODERATE neck pain (e.g., interferes with normal activities) AND [2] present > 3 days  Answer Assessment - Initial Assessment Questions 1. ONSET: "When did the pain begin?"      *No Answer* 2. LOCATION: "Where does it hurt?"      Neck 3. PATTERN "Does the pain come and go, or has it been constant since it started?"      constant 4. SEVERITY: "How bad is the pain?"  (Scale 1-10; or mild, moderate, severe)   - NO PAIN (0): no pain or only slight stiffness    - MILD (1-3): doesn't interfere with normal activities    - MODERATE (4-7): interferes with normal activities or awakens from sleep    - SEVERE (8-10):  excruciating pain, unable to do any normal activities       5. RADIATION: "Does the pain go anywhere else, shoot into your arms?"      6. CORD SYMPTOMS: "Any weakness or numbness of the arms or legs?"      7. CAUSE: "What do you think is causing the neck pain?"      8. NECK OVERUSE: "Any recent activities that involved turning or twisting the neck?"      9. OTHER SYMPTOMS: "Do  you have any other symptoms?" (e.g., headache, fever, chest pain, difficulty breathing, neck swelling)      10. PREGNANCY: "Is there any chance you are pregnant?" "When was your last menstrual period?"  Protocols used: Neck Pain or Stiffness-A-AH

## 2022-02-23 ENCOUNTER — Ambulatory Visit (INDEPENDENT_AMBULATORY_CARE_PROVIDER_SITE_OTHER): Payer: Medicaid Other | Admitting: Primary Care

## 2022-03-06 ENCOUNTER — Ambulatory Visit (INDEPENDENT_AMBULATORY_CARE_PROVIDER_SITE_OTHER): Payer: Medicaid Other | Admitting: Primary Care

## 2022-03-22 ENCOUNTER — Encounter (INDEPENDENT_AMBULATORY_CARE_PROVIDER_SITE_OTHER): Payer: Self-pay | Admitting: Primary Care

## 2022-03-22 ENCOUNTER — Ambulatory Visit (INDEPENDENT_AMBULATORY_CARE_PROVIDER_SITE_OTHER): Payer: Medicaid Other | Admitting: Primary Care

## 2022-03-22 VITALS — BP 146/87 | HR 106 | Resp 16 | Ht 68.5 in | Wt 271.0 lb

## 2022-03-22 DIAGNOSIS — I10 Essential (primary) hypertension: Secondary | ICD-10-CM

## 2022-03-22 DIAGNOSIS — F5102 Adjustment insomnia: Secondary | ICD-10-CM

## 2022-03-22 DIAGNOSIS — E119 Type 2 diabetes mellitus without complications: Secondary | ICD-10-CM

## 2022-03-22 DIAGNOSIS — M542 Cervicalgia: Secondary | ICD-10-CM

## 2022-03-22 LAB — POCT GLYCOSYLATED HEMOGLOBIN (HGB A1C): HbA1c, POC (controlled diabetic range): 9.4 % — AB (ref 0.0–7.0)

## 2022-03-22 MED ORDER — BASAGLAR KWIKPEN 100 UNIT/ML ~~LOC~~ SOPN: 24.0000 [IU] | PEN_INJECTOR | Freq: Every day | SUBCUTANEOUS | 1 refills | Status: DC

## 2022-03-22 MED ORDER — BUPROPION HCL ER (SR) 100 MG PO TB12: 100.0000 mg | ORAL_TABLET | Freq: Two times a day (BID) | ORAL | 1 refills | Status: AC

## 2022-03-22 MED ORDER — METFORMIN HCL 1000 MG PO TABS: 1000.0000 mg | ORAL_TABLET | Freq: Two times a day (BID) | ORAL | 3 refills | Status: DC

## 2022-03-22 MED ORDER — BUPROPION HCL ER (SR) 100 MG PO TB12: 100.0000 mg | ORAL_TABLET | Freq: Two times a day (BID) | ORAL | 1 refills | Status: DC

## 2022-03-22 MED ORDER — AMLODIPINE BESYLATE 10 MG PO TABS: 10.0000 mg | ORAL_TABLET | Freq: Every day | ORAL | 1 refills | Status: AC

## 2022-03-22 MED ORDER — VALSARTAN-HYDROCHLOROTHIAZIDE 160-25 MG PO TABS: 1.0000 | ORAL_TABLET | Freq: Every day | ORAL | 1 refills | Status: AC

## 2022-03-22 NOTE — Progress Notes (Signed)
Subjective:  Patient ID: David Eaton, male    DOB: Jan 19, 1980  Age: 42 y.o. MRN: [id number]  CC: Diabetes and Hypertension   HPI David Eaton presents for follow-up of diabetes. Patient does not check blood sugar at home  Compliant with meds - No Checking CBGs? No  Fasting avg -   Postprandial average -  Exercising regularly? - Yes Watching carbohydrate intake? - No Neuropathy ? - No Hypoglycemic events - No  - Recovers with :   Pertinent ROS:  Polyuria - No Polydipsia - No Vision problems - Yes Management of HTN- Patient has No headache, No chest pain, No abdominal pain - No Nausea, No new weakness tingling or numbness, No Cough - shortness of breath  Medications as noted below. Taking them regularly without complication/adverse reaction being reported today.   History David Eaton has a past medical history of Diabetes mellitus without complication ([place]) and Hypertension.   He has no past surgical history on file.   His family history is not on file.He reports that he has been smoking cigarettes. He has never used smokeless tobacco. He reports that he does not currently use alcohol. He reports that he does not currently use drugs.  Current Outpatient Medications on File Prior to Visit  Medication Sig Dispense Refill   amLODipine (NORVASC) 10 MG tablet Take 1 tablet (10 mg total) by mouth daily. 90 tablet 1   baclofen (LIORESAL) 10 MG tablet Take 0.5-1 tablets (5-10 mg total) by mouth 3 (three) times daily as needed for muscle spasms. 30 each 3   celecoxib (CELEBREX) 200 MG capsule Take 1 capsule (200 mg total) by mouth 2 (two) times daily as needed. 60 capsule 6   Insulin Glargine (BASAGLAR KWIKPEN) 100 UNIT/ML Inject 24 Units into the skin at bedtime. 15 mL 1   Insulin Pen Needle (PEN NEEDLES) 32G X 4 MM MISC 1 Bag by Does not apply route 2 (two) times daily. 100 each 6   metFORMIN (GLUCOPHAGE) 1000 MG tablet Take 1 tablet (1,000 mg total) by mouth 2 (two) times daily with  a meal. 180 tablet 3   methocarbamol (ROBAXIN) 500 MG tablet Take 1 tablet (500 mg total) by mouth 2 (two) times daily. 20 tablet 0   Semaglutide,0.25 or 0.'5MG'$ /DOS, (OZEMPIC, 0.25 OR 0.5 MG/DOSE,) 2 MG/3ML SOPN Inject 0.25 mg into the skin once a week. For 4 weeks. Then, increase to 0.5 mg once a week thereafter. 3 mL 1   traMADol (ULTRAM) 50 MG tablet Take 1 tablet (50 mg total) by mouth every 12 (twelve) hours as needed. 30 tablet 0   valsartan-hydrochlorothiazide (DIOVAN-HCT) 160-25 MG tablet Take 1 tablet by mouth daily. 90 tablet 1   No current facility-administered medications on file prior to visit.    ROS Comprehensive ROS Pertinent positive and negative noted in HPI    Objective:  Blood Pressure (Abnormal) 146/87 (BP Location: Left Arm, Patient Position: Sitting, Cuff Size: Large)   Pulse (Abnormal) 106   Respiration 16   Height 5' 8.5" (1.74 m)   Weight 271 lb (122.9 kg)   Oxygen Saturation 96%   Body Mass Index 40.61 kg/m   BP Readings from Last 3 Encounters:  03/22/22 (Abnormal) 146/87  10/20/21 (Abnormal) 188/103  10/06/21 (Abnormal) 158/95    Wt Readings from Last 3 Encounters:  03/22/22 271 lb (122.9 kg)  10/20/21 265 lb 12.8 oz (120.6 kg)  10/06/21 273 lb 5.9 oz (124 kg)    Physical Exam  Lab Results  Component  Value Date   HGBA1C 9.4 (A) 03/22/2022   HGBA1C 9.6 (A) 10/20/2021   HGBA1C 13.8 (A) 03/05/2020    Lab Results  Component Value Date   WBC 10.6 10/20/2021   HGB 15.0 10/20/2021   HCT 42.9 10/20/2021   PLT 390 10/20/2021   GLUCOSE 328 (H) 10/20/2021   CHOL 341 (H) 10/20/2021   TRIG 513 (H) 10/20/2021   HDL 63 10/20/2021   LDLCALC 175 (H) 10/20/2021   ALT 30 10/20/2021   AST 18 10/20/2021   NA 135 10/20/2021   K 4.8 10/20/2021   CL 97 10/20/2021   CREATININE 1.33 (H) 10/20/2021   BUN 20 10/20/2021   CO2 22 10/20/2021   HGBA1C 9.4 (A) 03/22/2022     Assessment & Plan:   David Eaton was seen today for diabetes and  hypertension.  Diagnoses and all orders for this visit: David Eaton was seen today for diabetes and hypertension.  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 DIET: Limit salt intake, read nutrition labels to check salt content, limit fried and high fatty foods  Avoid using multisymptom OTC cold preparations that generally contain sudafed which can rise BP. Consult with pharmacist on best cold relief products to use for persons with HTN EXERCISE Discussed incorporating exercise such as walking - 30 minutes most days of the week and can do in 10 minute intervals     Morbid obesity ([place]) Discussed diet and exercise for person with BMI >25. Instructed: You must burn more calories than you eat. Losing 5 percent of your body weight should be considered a success. In the longer term, losing more than 15 percent of your body weight and staying at this weight is an extremely good result. However, keep in mind that even losing 5 percent of your body weight leads to important health benefits, so try not to get discouraged if you're not able to lose more than this. Will recheck weight in 3-6 months. Admits to not complying with diet but just purchase a gym membership to start excercising  Comprehensive diabetic foot examination, type 2 DM, encounter for [facility]) -     Ambulatory referral to  Podiatry  Cervical pain (neck Right side of neck has a lump that is felt inside when swallowing and eating and palpable on exam -     Ambulatory referral to ENT    Follow-up:   No follow-ups on file.  The above assessment and management plan was discussed with the patient. The patient verbalized understanding of and has agreed to the management plan. Patient is aware to call the clinic if symptoms fail to improve or worsen. Patient is aware when to return to the clinic for a follow-up visit. Patient educated on when it is appropriate to go to the emergency department.   Juluis Mire, NP-C

## 2022-03-22 NOTE — Addendum Note (Signed)
Addended by: Verdell Carmine on: 03/22/2022 02:47 PM   Modules accepted: Orders

## 2022-03-22 NOTE — Patient Instructions (Signed)
Calorie Counting for Weight Loss Calories are units of energy. Your body needs a certain number of calories from food to keep going throughout the day. When you eat or drink more calories than your body needs, your body stores the extra calories mostly as fat. When you eat or drink fewer calories than your body needs, your body burns fat to get the energy it needs. Calorie counting means keeping track of how many calories you eat and drink each day. Calorie counting can be helpful if you need to lose weight. If you eat fewer calories than your body needs, you should lose weight. Ask your health care provider what a healthy weight is for you. For calorie counting to work, you will need to eat the right number of calories each day to lose a healthy amount of weight per week. A dietitian can help you figure out how many calories you need in a day and will suggest ways to reach your calorie goal. A healthy amount of weight to lose each week is usually 1-2 lb (0.5-0.9 kg). This usually means that your daily calorie intake should be reduced by 500-750 calories. Eating 1,200-1,500 calories a day can help most women lose weight. Eating 1,500-1,800 calories a day can help most men lose weight. What do I need to know about calorie counting? Work with your health care provider or dietitian to determine how many calories you should get each day. To meet your daily calorie goal, you will need to: Find out how many calories are in each food that you would like to eat. Try to do this before you eat. Decide how much of the food you plan to eat. Keep a food log. Do this by writing down what you ate and how many calories it had. To successfully lose weight, it is important to balance calorie counting with a healthy lifestyle that includes regular activity. Where do I find calorie information?  The number of calories in a food can be found on a Nutrition Facts label. If a food does not have a Nutrition Facts label, try  to look up the calories online or ask your dietitian for help. Remember that calories are listed per serving. If you choose to have more than one serving of a food, you will have to multiply the calories per serving by the number of servings you plan to eat. For example, the label on a package of bread might say that a serving size is 1 slice and that there are 90 calories in a serving. If you eat 1 slice, you will have eaten 90 calories. If you eat 2 slices, you will have eaten 180 calories. How do I keep a food log? After each time that you eat, record the following in your food log as soon as possible: What you ate. Be sure to include toppings, sauces, and other extras on the food. How much you ate. This can be measured in cups, ounces, or number of items. How many calories were in each food and drink. The total number of calories in the food you ate. Keep your food log near you, such as in a pocket-sized notebook or on an app or website on your mobile phone. Some programs will calculate calories for you and show you how many calories you have left to meet your daily goal. What are some portion-control tips? Know how many calories are in a serving. This will help you know how many servings you can have of a certain   food. Use a measuring cup to measure serving sizes. You could also try weighing out portions on a kitchen scale. With time, you will be able to estimate serving sizes for some foods. Take time to put servings of different foods on your favorite plates or in your favorite bowls and cups so you know what a serving looks like. Try not to eat straight from a food's packaging, such as from a bag or box. Eating straight from the package makes it hard to see how much you are eating and can lead to overeating. Put the amount you would like to eat in a cup or on a plate to make sure you are eating the right portion. Use smaller plates, glasses, and bowls for smaller portions and to prevent  overeating. Try not to multitask. For example, avoid watching TV or using your computer while eating. If it is time to eat, sit down at a table and enjoy your food. This will help you recognize when you are full. It will also help you be more mindful of what and how much you are eating. What are tips for following this plan? Reading food labels Check the calorie count compared with the serving size. The serving size may be smaller than what you are used to eating. Check the source of the calories. Try to choose foods that are high in protein, fiber, and vitamins, and low in saturated fat, trans fat, and sodium. Shopping Read nutrition labels while you shop. This will help you make healthy decisions about which foods to buy. Pay attention to nutrition labels for low-fat or fat-free foods. These foods sometimes have the same number of calories or more calories than the full-fat versions. They also often have added sugar, starch, or salt to make up for flavor that was removed with the fat. Make a grocery list of lower-calorie foods and stick to it. Cooking Try to cook your favorite foods in a healthier way. For example, try baking instead of frying. Use low-fat dairy products. Meal planning Use more fruits and vegetables. One-half of your plate should be fruits and vegetables. Include lean proteins, such as chicken, turkey, and fish. Lifestyle Each week, aim to do one of the following: 150 minutes of moderate exercise, such as walking. 75 minutes of vigorous exercise, such as running. General information Know how many calories are in the foods you eat most often. This will help you calculate calorie counts faster. Find a way of tracking calories that works for you. Get creative. Try different apps or programs if writing down calories does not work for you. What foods should I eat?  Eat nutritious foods. It is better to have a nutritious, high-calorie food, such as an avocado, than a food with  few nutrients, such as a bag of potato chips. Use your calories on foods and drinks that will fill you up and will not leave you hungry soon after eating. Examples of foods that fill you up are nuts and nut butters, vegetables, lean proteins, and high-fiber foods such as whole grains. High-fiber foods are foods with more than 5 g of fiber per serving. Pay attention to calories in drinks. Low-calorie drinks include water and unsweetened drinks. The items listed above may not be a complete list of foods and beverages you can eat. Contact a dietitian for more information. What foods should I limit? Limit foods or drinks that are not good sources of vitamins, minerals, or protein or that are high in unhealthy fats. These   include: Candy. Other sweets. Sodas, specialty coffee drinks, alcohol, and juice. The items listed above may not be a complete list of foods and beverages you should avoid. Contact a dietitian for more information. How do I count calories when eating out? Pay attention to portions. Often, portions are much larger when eating out. Try these tips to keep portions smaller: Consider sharing a meal instead of getting your own. If you get your own meal, eat only half of it. Before you start eating, ask for a container and put half of your meal into it. When available, consider ordering smaller portions from the menu instead of full portions. Pay attention to your food and drink choices. Knowing the way food is cooked and what is included with the meal can help you eat fewer calories. If calories are listed on the menu, choose the lower-calorie options. Choose dishes that include vegetables, fruits, whole grains, low-fat dairy products, and lean proteins. Choose items that are boiled, broiled, grilled, or steamed. Avoid items that are buttered, battered, fried, or served with cream sauce. Items labeled as crispy are usually fried, unless stated otherwise. Choose water, low-fat milk,  unsweetened iced tea, or other drinks without added sugar. If you want an alcoholic beverage, choose a lower-calorie option, such as a glass of wine or light beer. Ask for dressings, sauces, and syrups on the side. These are usually high in calories, so you should limit the amount you eat. If you want a salad, choose a garden salad and ask for grilled meats. Avoid extra toppings such as bacon, cheese, or fried items. Ask for the dressing on the side, or ask for olive oil and vinegar or lemon to use as dressing. Estimate how many servings of a food you are given. Knowing serving sizes will help you be aware of how much food you are eating at restaurants. Where to find more information Centers for Disease Control and Prevention: www.cdc.gov U.[place]. Department of Agriculture: myplate.gov Summary Calorie counting means keeping track of how many calories you eat and drink each day. If you eat fewer calories than your body needs, you should lose weight. A healthy amount of weight to lose per week is usually 1-2 lb (0.5-0.9 kg). This usually means reducing your daily calorie intake by 500-750 calories. The number of calories in a food can be found on a Nutrition Facts label. If a food does not have a Nutrition Facts label, try to look up the calories online or ask your dietitian for help. Use smaller plates, glasses, and bowls for smaller portions and to prevent overeating. Use your calories on foods and drinks that will fill you up and not leave you hungry shortly after a meal. This information is not intended to replace advice given to you by your health care provider. Make sure you discuss any questions you have with your health care provider. Document Revised: 02/13/2019 Document Reviewed: 02/13/2019 Elsevier Patient Education  2023 Elsevier Inc.  

## 2022-03-27 ENCOUNTER — Telehealth: Payer: Self-pay | Admitting: Primary Care

## 2022-03-27 NOTE — Telephone Encounter (Signed)
Will forward to provider  

## 2022-03-27 NOTE — Telephone Encounter (Signed)
Copied from [place] [id number]. Topic: Referral - Request for Referral >> Mar 27, 2022 11:12 AM Oley Balm A wrote: Has patient seen PCP for this complaint? Yes.   *If NO, is insurance requiring patient see PCP for this issue before PCP can refer them? Referral for which specialty: [facility] Preferred provider/office: [facility]  [address] [place] [address] [phone]  Reason for referral: neck and shoulder

## 2022-03-29 ENCOUNTER — Other Ambulatory Visit (INDEPENDENT_AMBULATORY_CARE_PROVIDER_SITE_OTHER): Payer: Self-pay | Admitting: Primary Care

## 2022-03-29 DIAGNOSIS — M542 Cervicalgia: Secondary | ICD-10-CM

## 2022-03-30 ENCOUNTER — Other Ambulatory Visit (HOSPITAL_COMMUNITY): Payer: Self-pay

## 2022-04-24 ENCOUNTER — Ambulatory Visit: Payer: Medicaid Other | Admitting: Pharmacist

## 2022-05-12 ENCOUNTER — Ambulatory Visit: Payer: Medicaid Other | Admitting: Pharmacist

## 2022-06-05 ENCOUNTER — Other Ambulatory Visit: Payer: Self-pay

## 2022-06-05 ENCOUNTER — Ambulatory Visit (INDEPENDENT_AMBULATORY_CARE_PROVIDER_SITE_OTHER): Payer: Medicaid Other | Admitting: Primary Care

## 2022-06-05 ENCOUNTER — Other Ambulatory Visit: Payer: Self-pay | Admitting: Pharmacist

## 2022-06-05 MED ORDER — LANTUS SOLOSTAR 100 UNIT/ML ~~LOC~~ SOPN: 24.0000 [IU] | PEN_INJECTOR | Freq: Every day | SUBCUTANEOUS | 1 refills | Status: AC

## 2022-06-14 ENCOUNTER — Other Ambulatory Visit (INDEPENDENT_AMBULATORY_CARE_PROVIDER_SITE_OTHER): Payer: Self-pay | Admitting: Primary Care

## 2022-06-14 NOTE — Telephone Encounter (Signed)
Will forward to provider  

## 2022-06-22 ENCOUNTER — Ambulatory Visit (INDEPENDENT_AMBULATORY_CARE_PROVIDER_SITE_OTHER): Payer: Medicaid Other | Admitting: Primary Care

## 2022-08-01 ENCOUNTER — Other Ambulatory Visit (INDEPENDENT_AMBULATORY_CARE_PROVIDER_SITE_OTHER): Payer: Self-pay

## 2022-08-01 DIAGNOSIS — E119 Type 2 diabetes mellitus without complications: Secondary | ICD-10-CM

## 2022-08-01 MED ORDER — METFORMIN HCL 1000 MG PO TABS
1000.0000 mg | ORAL_TABLET | Freq: Two times a day (BID) | ORAL | 0 refills | Status: AC
Start: 2022-08-01 — End: ?

## 2022-09-03 ENCOUNTER — Other Ambulatory Visit (INDEPENDENT_AMBULATORY_CARE_PROVIDER_SITE_OTHER): Payer: Self-pay | Admitting: Primary Care

## 2022-10-10 ENCOUNTER — Telehealth (INDEPENDENT_AMBULATORY_CARE_PROVIDER_SITE_OTHER): Payer: Medicaid Other | Admitting: Primary Care

## 2022-10-12 ENCOUNTER — Telehealth: Payer: Self-pay

## 2022-10-12 ENCOUNTER — Other Ambulatory Visit: Payer: Self-pay

## 2022-10-12 NOTE — Telephone Encounter (Signed)
Pharmacy Patient Advocate Encounter  Received notification from [facility] that Prior Authorization for [facility] has been APPROVED from 10/12/2022 to 10/12/2023   PA #/Case ID/Reference #: [id number]

## 2022-10-15 ENCOUNTER — Other Ambulatory Visit (INDEPENDENT_AMBULATORY_CARE_PROVIDER_SITE_OTHER): Payer: Self-pay | Admitting: Primary Care

## 2022-10-16 NOTE — Telephone Encounter (Signed)
Requested Prescriptions  Pending Prescriptions Disp Refills   OZEMPIC, 0.25 OR 0.5 MG/DOSE, 2 MG/3ML SOPN [Pharmacy Med Name: Ozempic (0.25 or 0.5 MG/DOSE) 2 MG/3ML Subcutaneous Solution Pen-injector] 3 mL 0    Sig: INJECT 0.25 MG SUBCUTANEOUSLY ONCE A WEEK FOR 4 WEEKS , THEN INCREASE 0.5 MG ONCE A WEEK THEREAFTER     Endocrinology:  Diabetes - GLP-1 Receptor Agonists - semaglutide Failed - 10/15/2022 11:39 AM      Failed - HBA1C in normal range and within 180 days    HbA1c, POC (controlled diabetic range)  Date Value Ref Range Status  03/22/2022 9.4 (A) 0.0 - 7.0 % Final         Failed - Cr in normal range and within 360 days    Creatinine, Ser  Date Value Ref Range Status  10/20/2021 1.33 (H) 0.76 - 1.27 mg/dL Final         Passed - Valid encounter within last 6 months    Recent Outpatient Visits           6 months ago Type 2 diabetes mellitus without complication, without long-term current use of insulin (HCC)   [place] [facility] Grayce Sessions, NP   10 months ago Adjustment disorder with mixed anxiety and depressed mood   [place] [facility] Grayce Sessions, NP   12 months ago Essential hypertension   [place] [facility] Grayce Sessions, NP   2 years ago Essential hypertension   [place] [facility] Grayce Sessions, NP   2 years ago Essential hypertension   [place] [facility] Grayce Sessions, NP

## 2023-01-11 ENCOUNTER — Other Ambulatory Visit (INDEPENDENT_AMBULATORY_CARE_PROVIDER_SITE_OTHER): Payer: Self-pay | Admitting: Primary Care

## 2023-01-11 NOTE — Telephone Encounter (Signed)
Medication Refill -  Most Recent Primary Care Visit:  Provider: Grayce Sessions  Department: [facility]  Visit Type: MYCHART VIDEO VISIT  Date: 10/10/2022  Medication: traMADol (ULTRAM) 50 MG tablet [[id number]]   Has the patient contacted their pharmacy? Yes  (Agent: If yes, when and what did the pharmacy advise?) Contact Office   Is this the correct pharmacy for this prescription? Yes  This is the patient's preferred pharmacy:  [id number] - [place], [place] - [address] [place] [address] Phone: [phone] Fax: [phone]    Has the prescription been filled recently? No  Is the patient out of the medication? Yes  Has the patient been seen for an appointment in the last year OR does the patient have an upcoming appointment? Yes  Can we respond through MyChart? Yes  Agent: Please be advised that Rx refills may take up to 3 business days. We ask that you follow-up with your pharmacy.

## 2023-01-12 ENCOUNTER — Telehealth (INDEPENDENT_AMBULATORY_CARE_PROVIDER_SITE_OTHER): Payer: Medicaid Other | Admitting: Primary Care

## 2023-01-16 NOTE — Telephone Encounter (Signed)
 Requested medication (s) are due for refill today: yes  Requested medication (s) are on the active medication list: yes    Last refill: 12/29/21  #30  0 refills  Future visit scheduled no  Notes to clinic:Historical provider, please review. Thank you  Requested Prescriptions  Pending Prescriptions Disp Refills   traMADol  (ULTRAM ) 50 MG tablet 30 tablet 0    Sig: Take 1 tablet (50 mg total) by mouth every 12 (twelve) hours as needed.     Not Delegated - Analgesics:  Opioid Agonists Failed - 01/16/2023  1:46 PM      Failed - This refill cannot be delegated      Failed - Urine Drug Screen completed in last 360 days      Failed - Valid encounter within last 3 months    Recent Outpatient Visits           10 months ago Type 2 diabetes mellitus without complication, without long-term current use of insulin  (HCC)   [place] [facility] Celestia Rosaline SQUIBB, NP   1 year ago Adjustment disorder with mixed anxiety and depressed mood   [place] [facility] Celestia Rosaline SQUIBB, NP   1 year ago Essential hypertension   [place] [facility] Celestia Rosaline SQUIBB, NP   2 years ago Essential hypertension   [place] [facility] Celestia Rosaline SQUIBB, NP   2 years ago Essential hypertension   [place] [facility] Celestia Rosaline SQUIBB, NP

## 2023-02-05 ENCOUNTER — Other Ambulatory Visit: Payer: Self-pay | Admitting: Family Medicine
